# Patient Record
Sex: Female | Born: 1940 | Race: White | Hispanic: No | Marital: Married | Smoking: Never smoker
Health system: Southern US, Community
[De-identification: ages and names within clinical notes are randomized; demographics above are authoritative.]

## PROBLEM LIST (undated history)

## (undated) DIAGNOSIS — K859 Acute pancreatitis without necrosis or infection, unspecified: Secondary | ICD-10-CM

## (undated) DIAGNOSIS — I10 Essential (primary) hypertension: Secondary | ICD-10-CM

## (undated) DIAGNOSIS — Z9109 Other allergy status, other than to drugs and biological substances: Secondary | ICD-10-CM

## (undated) DIAGNOSIS — K7290 Hepatic failure, unspecified without coma: Secondary | ICD-10-CM

## (undated) DIAGNOSIS — E119 Type 2 diabetes mellitus without complications: Secondary | ICD-10-CM

## (undated) HISTORY — DX: Type 2 diabetes mellitus without complications: E11.9

## (undated) HISTORY — PX: OTHER SURGICAL HISTORY: SHX169

## (undated) HISTORY — DX: Other allergy status, other than to drugs and biological substances: Z91.09

## (undated) HISTORY — PX: COLONOSCOPY: SHX174

## (undated) HISTORY — PX: KNEE ARTHROSCOPY: SUR90

## (undated) HISTORY — DX: Hepatic failure, unspecified without coma: K72.90

## (undated) HISTORY — DX: Acute pancreatitis without necrosis or infection, unspecified: K85.90

## (undated) HISTORY — DX: Essential (primary) hypertension: I10

---

## 1978-10-30 HISTORY — PX: CHOLECYSTECTOMY: SHX55

## 1989-08-29 HISTORY — PX: CARPAL TUNNEL RELEASE: SHX101

## 1990-03-30 HISTORY — PX: SHOULDER ARTHROSCOPY: SHX128

## 1993-03-29 HISTORY — PX: ERCP: SHX60

## 1993-05-29 HISTORY — PX: LIVER BIOPSY: SHX301

## 1995-10-25 HISTORY — PX: KNEE ARTHROPLASTY: SHX992

## 1997-10-05 ENCOUNTER — Other Ambulatory Visit: Admission: RE | Admit: 1997-10-05 | Discharge: 1997-10-05 | Payer: Self-pay | Admitting: *Deleted

## 1998-05-18 ENCOUNTER — Ambulatory Visit (HOSPITAL_COMMUNITY): Admission: RE | Admit: 1998-05-18 | Discharge: 1998-05-18 | Payer: Self-pay | Admitting: Sports Medicine

## 1998-05-18 ENCOUNTER — Encounter: Payer: Self-pay | Admitting: Sports Medicine

## 1998-05-31 ENCOUNTER — Encounter: Payer: Self-pay | Admitting: Neurosurgery

## 1998-05-31 ENCOUNTER — Ambulatory Visit (HOSPITAL_COMMUNITY): Admission: RE | Admit: 1998-05-31 | Discharge: 1998-05-31 | Payer: Self-pay | Admitting: Neurosurgery

## 1998-09-15 ENCOUNTER — Encounter: Payer: Self-pay | Admitting: Orthopedic Surgery

## 1998-09-15 ENCOUNTER — Ambulatory Visit (HOSPITAL_COMMUNITY): Admission: RE | Admit: 1998-09-15 | Discharge: 1998-09-15 | Payer: Self-pay | Admitting: Neurosurgery

## 1998-09-26 ENCOUNTER — Other Ambulatory Visit: Admission: RE | Admit: 1998-09-26 | Discharge: 1998-09-26 | Payer: Self-pay | Admitting: *Deleted

## 1998-10-05 ENCOUNTER — Ambulatory Visit (HOSPITAL_COMMUNITY): Admission: RE | Admit: 1998-10-05 | Discharge: 1998-10-05 | Payer: Self-pay | Admitting: Neurosurgery

## 1998-10-11 ENCOUNTER — Ambulatory Visit (HOSPITAL_BASED_OUTPATIENT_CLINIC_OR_DEPARTMENT_OTHER): Admission: RE | Admit: 1998-10-11 | Discharge: 1998-10-11 | Payer: Self-pay | Admitting: Orthopedic Surgery

## 1998-11-30 ENCOUNTER — Observation Stay (HOSPITAL_COMMUNITY): Admission: RE | Admit: 1998-11-30 | Discharge: 1998-12-01 | Payer: Self-pay | Admitting: Neurosurgery

## 1999-01-31 ENCOUNTER — Ambulatory Visit (HOSPITAL_BASED_OUTPATIENT_CLINIC_OR_DEPARTMENT_OTHER): Admission: RE | Admit: 1999-01-31 | Discharge: 1999-01-31 | Payer: Self-pay | Admitting: Orthopedic Surgery

## 1999-11-08 ENCOUNTER — Encounter: Payer: Self-pay | Admitting: Internal Medicine

## 1999-11-08 ENCOUNTER — Encounter: Admission: RE | Admit: 1999-11-08 | Discharge: 1999-11-08 | Payer: Self-pay | Admitting: Internal Medicine

## 2000-09-13 ENCOUNTER — Encounter: Payer: Self-pay | Admitting: Urology

## 2000-09-17 ENCOUNTER — Ambulatory Visit (HOSPITAL_COMMUNITY): Admission: RE | Admit: 2000-09-17 | Discharge: 2000-09-17 | Payer: Self-pay | Admitting: Urology

## 2000-09-17 HISTORY — PX: INCONTINENCE SURGERY: SHX676

## 2000-10-14 ENCOUNTER — Encounter (HOSPITAL_BASED_OUTPATIENT_CLINIC_OR_DEPARTMENT_OTHER): Payer: Self-pay | Admitting: Internal Medicine

## 2000-10-14 ENCOUNTER — Encounter: Admission: RE | Admit: 2000-10-14 | Discharge: 2000-10-14 | Payer: Self-pay | Admitting: Internal Medicine

## 2000-11-08 ENCOUNTER — Encounter (HOSPITAL_BASED_OUTPATIENT_CLINIC_OR_DEPARTMENT_OTHER): Payer: Self-pay | Admitting: Internal Medicine

## 2000-11-08 ENCOUNTER — Encounter: Admission: RE | Admit: 2000-11-08 | Discharge: 2000-11-08 | Payer: Self-pay | Admitting: Internal Medicine

## 2001-11-04 ENCOUNTER — Encounter: Admission: RE | Admit: 2001-11-04 | Discharge: 2001-11-04 | Payer: Self-pay | Admitting: Internal Medicine

## 2001-11-04 ENCOUNTER — Encounter: Payer: Self-pay | Admitting: Internal Medicine

## 2002-09-28 ENCOUNTER — Ambulatory Visit (HOSPITAL_COMMUNITY): Admission: RE | Admit: 2002-09-28 | Discharge: 2002-09-28 | Payer: Self-pay | Admitting: Gastroenterology

## 2002-11-03 ENCOUNTER — Encounter: Admission: RE | Admit: 2002-11-03 | Discharge: 2002-11-03 | Payer: Self-pay | Admitting: Internal Medicine

## 2002-11-03 ENCOUNTER — Encounter: Payer: Self-pay | Admitting: Internal Medicine

## 2002-11-27 ENCOUNTER — Encounter: Admission: RE | Admit: 2002-11-27 | Discharge: 2002-11-27 | Payer: Self-pay | Admitting: Internal Medicine

## 2003-11-04 ENCOUNTER — Encounter: Admission: RE | Admit: 2003-11-04 | Discharge: 2003-11-04 | Payer: Self-pay | Admitting: Internal Medicine

## 2004-06-12 ENCOUNTER — Other Ambulatory Visit: Admission: RE | Admit: 2004-06-12 | Discharge: 2004-06-12 | Payer: Self-pay | Admitting: Internal Medicine

## 2004-11-07 ENCOUNTER — Encounter: Admission: RE | Admit: 2004-11-07 | Discharge: 2004-11-07 | Payer: Self-pay | Admitting: Internal Medicine

## 2005-03-29 HISTORY — PX: OTHER SURGICAL HISTORY: SHX169

## 2005-04-03 ENCOUNTER — Inpatient Hospital Stay (HOSPITAL_COMMUNITY): Admission: EM | Admit: 2005-04-03 | Discharge: 2005-04-15 | Payer: Self-pay | Admitting: *Deleted

## 2005-07-17 ENCOUNTER — Encounter (INDEPENDENT_AMBULATORY_CARE_PROVIDER_SITE_OTHER): Payer: Self-pay | Admitting: *Deleted

## 2005-07-17 ENCOUNTER — Inpatient Hospital Stay (HOSPITAL_COMMUNITY): Admission: RE | Admit: 2005-07-17 | Discharge: 2005-07-22 | Payer: Self-pay | Admitting: Surgery

## 2005-07-17 HISTORY — PX: OTHER SURGICAL HISTORY: SHX169

## 2005-11-09 ENCOUNTER — Encounter: Admission: RE | Admit: 2005-11-09 | Discharge: 2005-11-09 | Payer: Self-pay | Admitting: Internal Medicine

## 2006-10-31 ENCOUNTER — Encounter: Admission: RE | Admit: 2006-10-31 | Discharge: 2006-10-31 | Payer: Self-pay | Admitting: Internal Medicine

## 2007-04-07 IMAGING — CT CT ABDOMEN W/O CM
2 of 4 series · 17 of 46 positions shown, 19 images · IV contrast (READICAT)
Comparison: none

CLINICAL DATA: Right lower quadrant and pelvic pain with fever.  Reported known history of biliary cirrhosis.  Cholecystectomy.  Reportedly allergic to IV contrast media.  
ABDOMEN CT WITHOUT CONTRAST:
TECHNIQUE: Multidetector CT imaging of the abdomen was performed following the standard protocol without IV contrast.  Oral Gastrografin was used.
TECHNIQUE: Multidetector CT imaging of the pelvis was performed following the standard protocol without IV contrast. Oral Gastrografin was used.

[Series 2: abd pelvis w/o cm · axial · non-contrast · 0.86mm/px · z∈[-479,-64]mm · 14 of 91 slices shown, 16 images]
[im 4/91  soft-tissue]
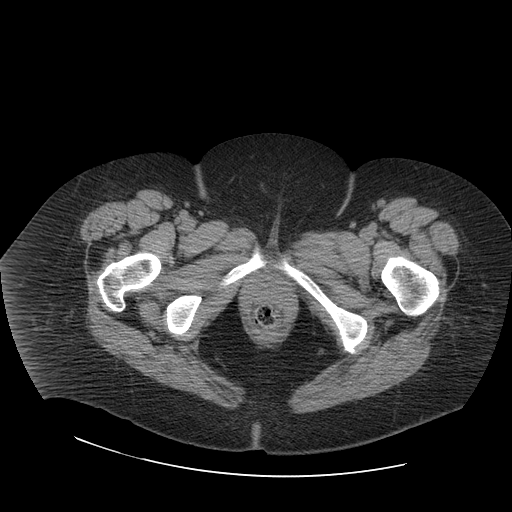
[im 4/91  bone]
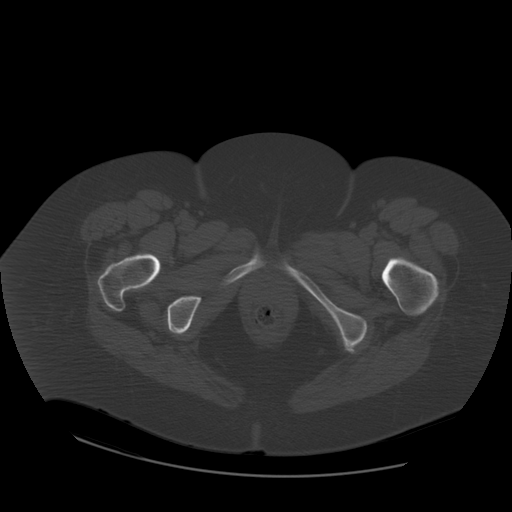
[im 11/91  soft-tissue]
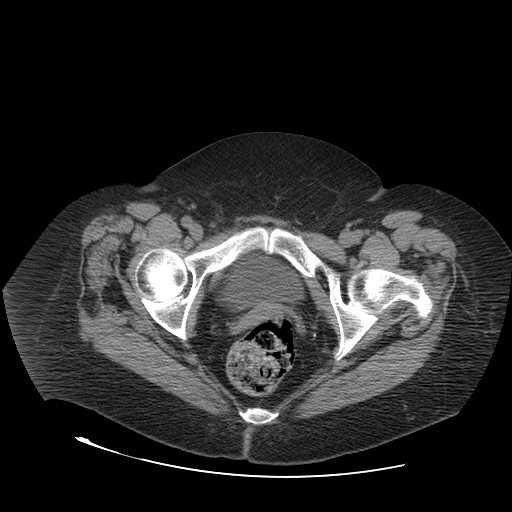
[im 19/91  soft-tissue]
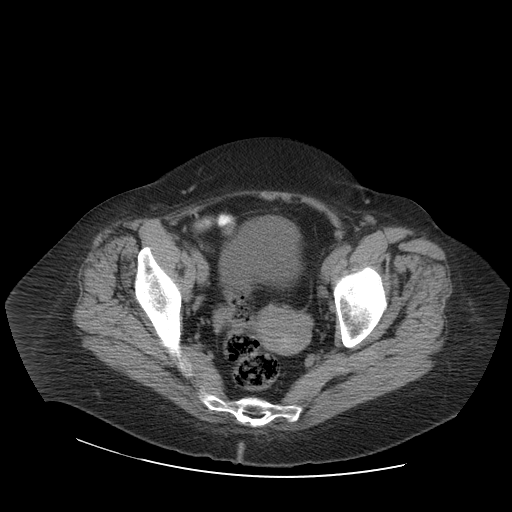
[im 26/91  soft-tissue]
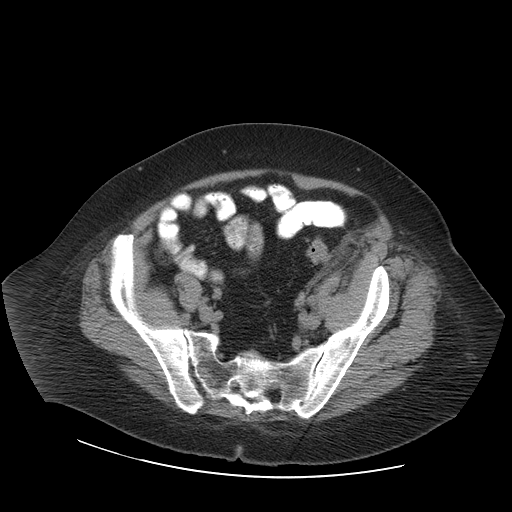
[im 29/91  soft-tissue]
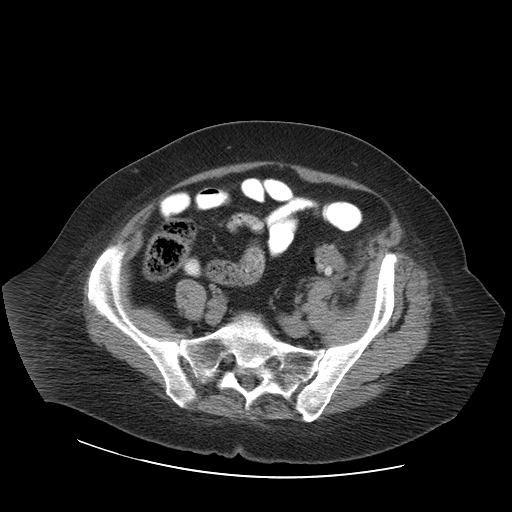
[im 37/91  soft-tissue]
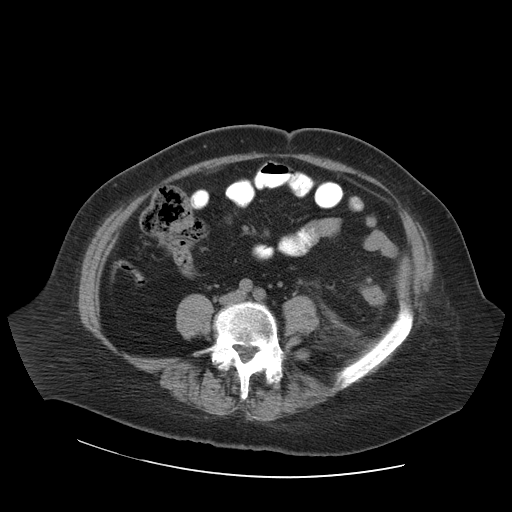
[im 44/91  soft-tissue]
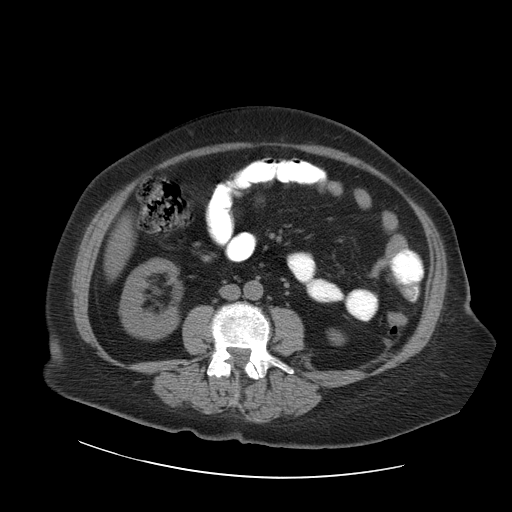
[im 47/91  soft-tissue]
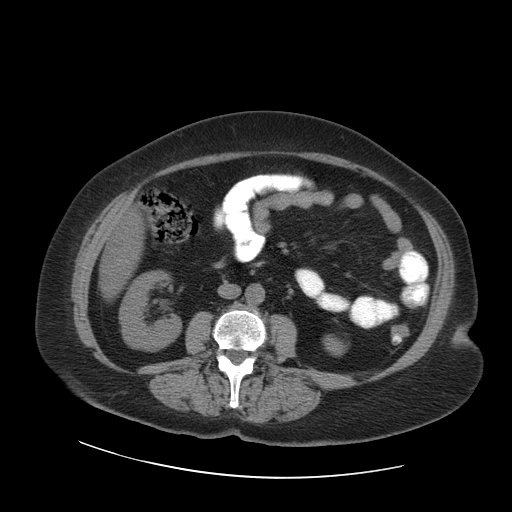
[im 55/91  soft-tissue]
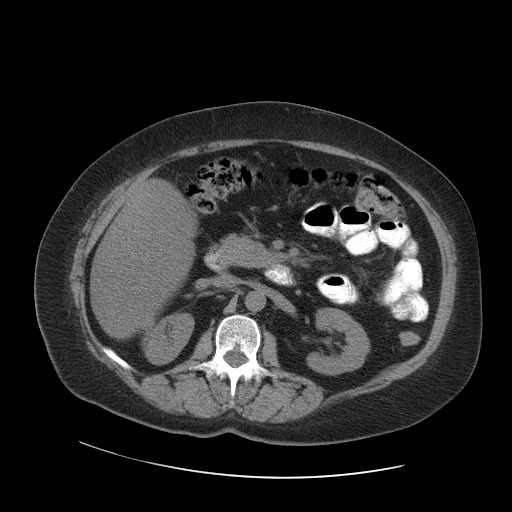
[im 55/91  bone]
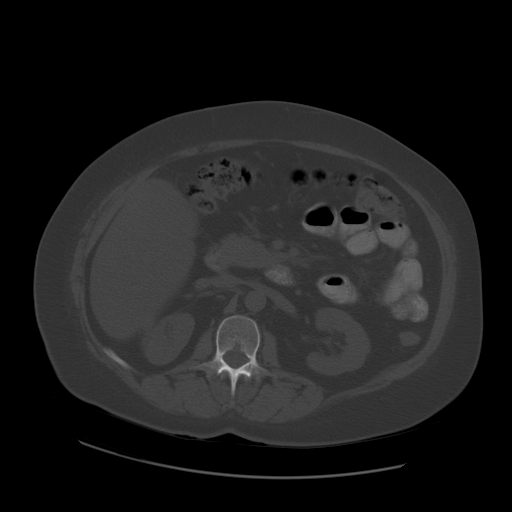
[im 62/91  soft-tissue]
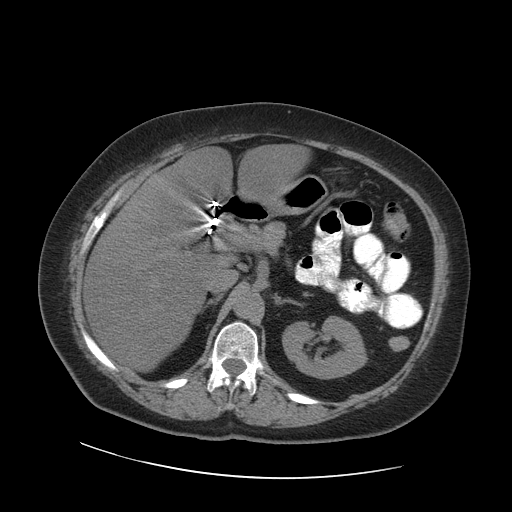
[im 69/91  soft-tissue]
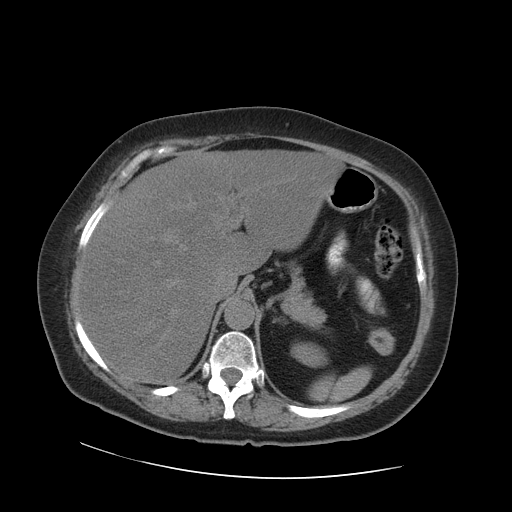
[im 73/91  soft-tissue]
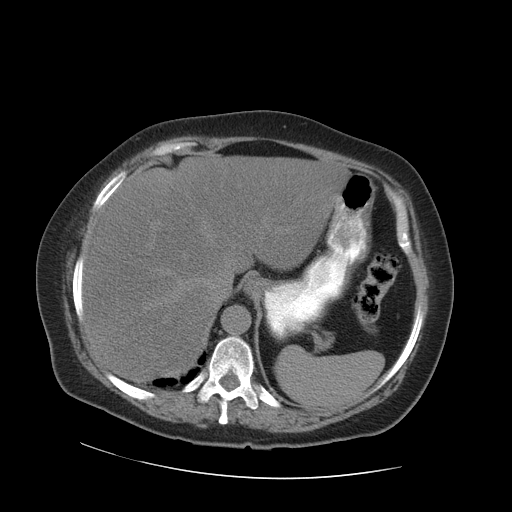
[im 80/91  soft-tissue]
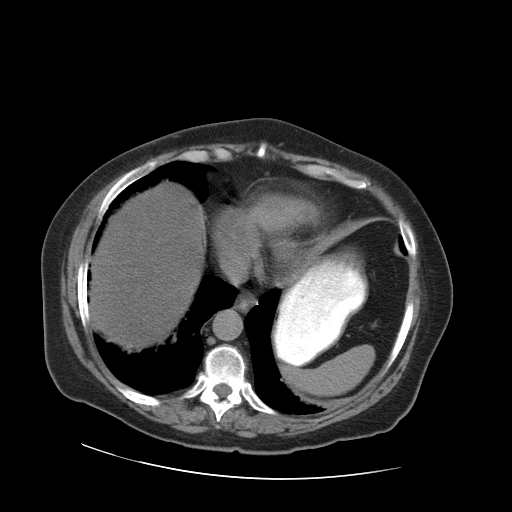
[im 87/91  soft-tissue]
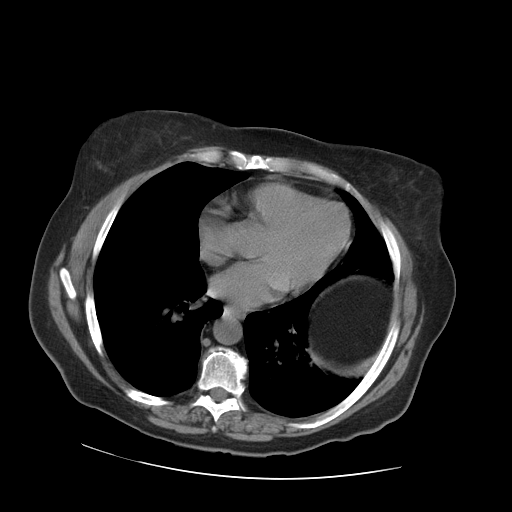

[Series 104: reformatted · coronal · 0.92mm/px · 3 of 120 slices shown]
[im 40/120  soft-tissue]
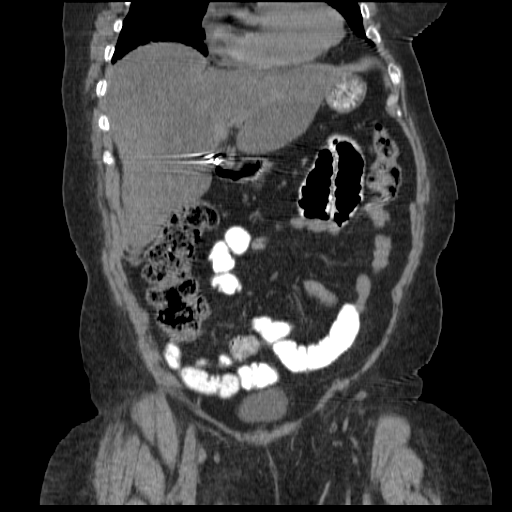
[im 53/120  soft-tissue]
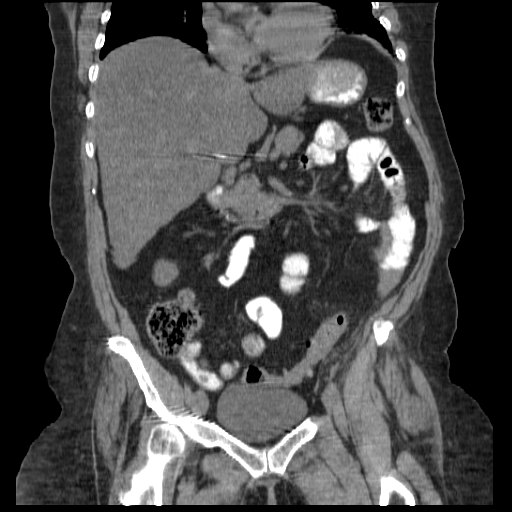
[im 67/120  soft-tissue]
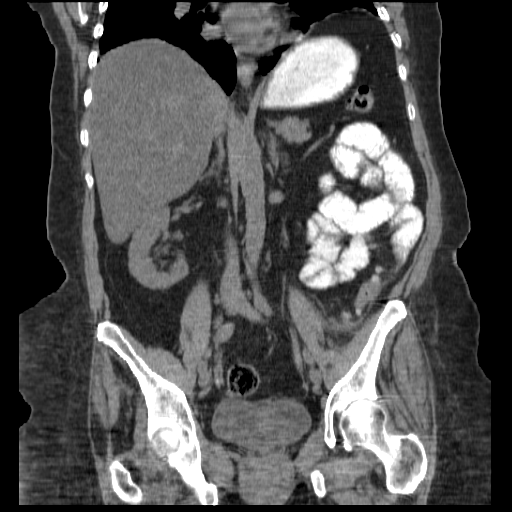

[17 of 46 positions shown; findings below may reference images not displayed]

FINDINGS: Mild linear scarring in the lower lung zones.  Diffuse fatty infiltration of the liver.  There may be mild hepatomegaly.  Metallic clips gallbladder fossa secondary to cholecystectomy.  Negative for obstructive uropathy.  Abnormal thickening of the wall of the distal descending colon and thickening involving the lateroconal and anterior pararenal fascia.  Multiple nearby diverticula.   focal perforation as there appear to be focal extraluminal small gas collections in a left posterolateral paracolic position.  I feel the findings are most compatible with diverticulitis.
IMPRESSION: CT findings are most compatible with distal descending colon diverticulitis with probably focal perforation.  The patient is at risk for formation of pericolic abscess.  
PELVIS CT WITHOUT CONTRAST:
FINDINGS: Uterus and adnexal regions appear unremarkable.  Bladder unremarkable.  Pelvic sidewalls well defined.  See abdominal CT report concerning findings compatible with distal descending colon diverticulitis.
IMPRESSION: No acute primary pelvic abnormality.  Distal descending colon diverticulitis.

## 2007-04-08 IMAGING — CR DG CHEST 1V PORT
1 series · 1 of 1 positions shown · non-contrast
Comparison: 04/03/05.

CLINICAL DATA: Evaluate PICC line placement. 
 PORTABLE CHEST ? 1 VIEW ? 04/04/05:

[view not recorded]
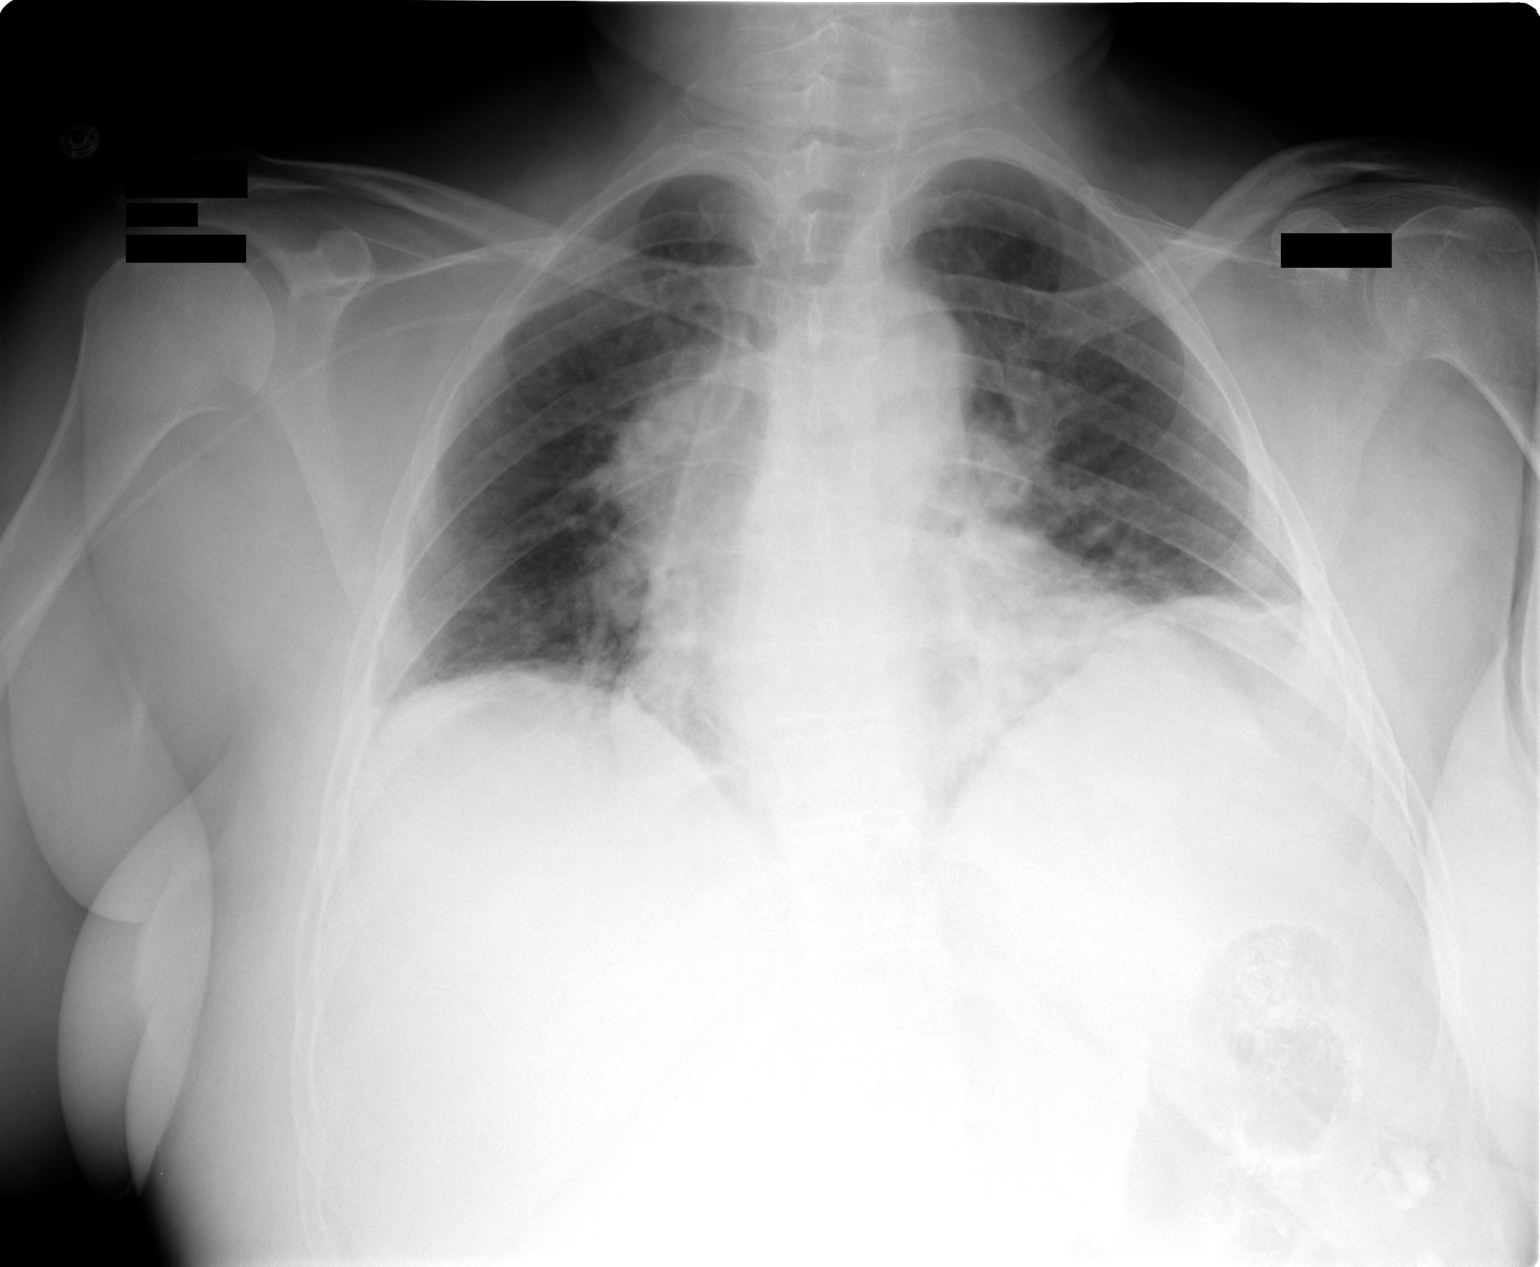

[1 of 1 positions shown; findings below may reference images not displayed]

FINDINGS: The lungs are underinflated.  There is bibasilar atelectasis.
 A right perihilar mass is identified.  It was not seen on the exam from 04/03/05.  However, on that examination, the patient was rotated to the left, and this may have been obscured by the mediastinum.  I would recommend a dedicated PA and lateral chest, as this may represent lymphadenopathy, a mediastinal mass, or PA hypertension.
 The PICC line is within the cavoatrial junction.
IMPRESSION: 1.  PICC line within cavoatrial junction. 
 2.  Right perihilar mass.  Recommend PA lateral chest. 
 3.  Underinflated lungs with bibasilar atelectasis.

## 2007-11-04 ENCOUNTER — Encounter: Admission: RE | Admit: 2007-11-04 | Discharge: 2007-11-04 | Payer: Self-pay | Admitting: Internal Medicine

## 2008-11-02 ENCOUNTER — Encounter: Admission: RE | Admit: 2008-11-02 | Discharge: 2008-11-02 | Payer: Self-pay | Admitting: Internal Medicine

## 2009-11-01 ENCOUNTER — Encounter: Admission: RE | Admit: 2009-11-01 | Discharge: 2009-11-01 | Payer: Self-pay | Admitting: Internal Medicine

## 2010-02-19 ENCOUNTER — Encounter: Payer: Self-pay | Admitting: Internal Medicine

## 2010-04-19 ENCOUNTER — Ambulatory Visit: Payer: Self-pay | Admitting: Ophthalmology

## 2010-05-15 ENCOUNTER — Ambulatory Visit: Payer: Self-pay | Admitting: Ophthalmology

## 2010-06-12 ENCOUNTER — Ambulatory Visit: Payer: Self-pay | Admitting: Ophthalmology

## 2010-06-16 NOTE — Op Note (Signed)
[facility]  Patient:    [name]                         MRN: [id number] Proc. Date: 01/31/99 Adm. Date:  [id number] Attending:  Sypher, Jerilynn Birkenhead CC:         Katy Fitch. Sypher, Montez Hageman., M.D. (2 copies)                           Operative Report  PREOPERATIVE DIAGNOSIS:  Severe degenerative arthritis, right index finger, distal interphalangeal joint.  POSTOPERATIVE DIAGNOSIS:  Severe degenerative arthritis, right index finger, distal interphalangeal joint.  OPERATION:  Arthrodesis of right index finger DIP joint.  OPERATING SURGEON:  Katy Fitch. Sypher, Montez Hageman., M.D.  ASSISTANT:  Jaquelyn Bitter. Chabon, P.A.  ANESTHESIA:  Axillary block.  SUPERVISING ANESTHESIOLOGIST:  Judie Petit, M.D.  INDICATIONS:  [name] is a 70 year old woman who has had bilateral index finger DIP arthrosis.  She is status post left index DIP arthrodesis and was quite pleased with the result.  She requested a similar procedure on the right for pain control.  DESCRIPTION OF PROCEDURE:  Anjeli Casad was brought to the operating room and placed in the supine position on the operating table.  Following induction of general sedation, axillary block was placed.  When anesthesia was satisfactory, the arm was prepped with Betadine soap and solution and sterilely draped.  Following exsanguination of the limb with an Esmarch bandage, an arterial tourniquet was inflated to 220 mmHg.  The procedure commenced with a dorsal curvilinear incision, exposing the DIP joint.  The collateral ligaments were released and resected. he proximal portion of the distal phalanx was tailored with a power bur to a cup shape, and the middle phalangeal heal tailored with rongeurs and a power bur to a bullet shape.  The joint was positioned at 10 degrees flexion and neutral rotation, and no radial or ulnar deviation, and secured with two 0.035 inch Kirschner wires with retrograde  technique.  A C-arm fluoroscopic image was obtained revealing satisfactory position. However, there was a small bony gap on the ulnar aspect.  This was subsequently filled with an underlying bone graft harvested from the metaphysis of the middle phalanx. his was morcellized and packed into the interosseous space.  This was a very satisfactory way to correct the bony deficit.  The wound was the repaired with a mattress suture of 4-0 Vicryl in the extensor  tendon followed by repair of the skin with interrupted sutures of 5-0 nylon.  The wound was dressed with Xeroflo, sterile gauze, and little foam splint. There were no apparent complications.  For after care, Ms. Sundby was given a Marcaine  block of 0.25% at the metacarpal head level followed by prescriptions for Percocet 1-2 tablets p.o. q.4-6h. p.r.n. pain, 20 tablets without refill, and also, Keflex 500 mg 1 p.o. q.8h. x 4 days as a prophylaxis antibiotic. DD:  01/31/99 TD:  01/31/99 Job: [id number]

## 2010-06-16 NOTE — H&P (Signed)
NAMEDALLANA, [name]                 ACCOUNT NO.:  [id number]   MEDICAL RECORD NO.:  [id number]          PATIENT TYPE:  EMS   LOCATION:  MAJO                         FACILITY:  [facility]   PHYSICIAN:  Ladell Pier, M.D.   DATE OF BIRTH:  07-30-1940   DATE OF ADMISSION:  04/03/2005  DATE OF DISCHARGE:                                HISTORY & PHYSICAL   CHIEF COMPLAINT:  Abdominal pain.   HISTORY OF PRESENT ILLNESS:  Patient is a 70 year old white female.  Past  medical history significant for multi medical problems including  hypertension, GERD, diverticulosis, and osteopenia.  Patient complains of  intermittent abdominal pain for approximately two weeks, then two to three  days ago the pain resolved but she was sore across the lower abdomen.  Then  last night the pain returned which is much more severe than previous.  It  radiated across the lower abdomen.  She took __________ aspirin without  relief of her symptoms.  She started experiencing some fevers, chills, and  nausea, but no vomiting.  Her husband took her to the emergency department.   PAST MEDICAL HISTORY:  1.  Hypertension.  2.  Obesity with a BMI of 35.  3.  Peripheral edema.  4.  GERD.  5.  Osteopenia.  6.  Diverticulosis and colonoscopy done by Dr. Sherin Quarry July 2006.  7.  History of primary biliary cirrhosis followed at [facility].      1.  ERCP done in 1995.      2.  Liver biopsy 1995.  8.  Osteoarthritis with multiple orthopedic surgery per Dr. Eulah Pont.  9.  Cholecystectomy 1980.   FAMILY HISTORY:  Father died of leukemia at 1 years old.  Mother died of  anorexia at 66 years old.  Two sisters, one with lupus.  The other one is  healthy.   SOCIAL HISTORY:  She is married with two grown children.  Used tobacco for a  short time while in college.  She is presently in disability secondary to  multiple joint problems.  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  She drinks socially and she lives in  [place].   MEDICATIONS:  1.  Tenex 2 mg q.a.m.  2.  Actigall 300 mg two twice a day.  3.  Ecotrin 325 mg daily.  4.  Ranitidine 150 mg daily, 75 mg q.h.s. p.r.n.  5.  Centrum daily.  6.  Reglan 10 mg q.a.c. and h.s.  7.  Lisinopril 40 mg daily.  8.  HCTZ 25 mg daily.  9.  Lasix 20 mg daily p.r.n.   ALLERGIES:  IODINE, BETADINE, VICODIN, DEMEROL, CODEINE, BACTRIM, SULFA,  TALWIN, CALCIUM CHANNEL BLOCKERS, TUSSIN, TUSSIONEX.   REVIEW OF SYSTEMS:  See HPI.   PHYSICAL EXAMINATION:  VITAL SIGNS:  Temperature 101.2, blood pressure  130/55, pulse 100, respirations 24, pulse ox 94% on room air.  HEENT:  Head is normocephalic, atraumatic.  Pupils are equal, round, and  reactive to light.  Throat without erythema.  CARDIOVASCULAR:  Regular rate and rhythm.  LUNGS:  Clear bilaterally.  No  wheezes, rhonchi, or rales.  ABDOMEN:  Soft with left lower quadrant focal tenderness.  Positive bowel  sounds.  EXTREMITIES:  Without edema.   LABORATORIES:  Sodium 137, potassium 4.4, chloride 104, CO2 23, BUN 25,  creatinine 1, glucose 218.  WBC 19, hemoglobin 12.3, platelets 386.  LFTs  are normal.  CT of the abdomen showed distal distending colon with focal  perforation, risks of pericolic abscess formation.   ASSESSMENT/PLAN:  1.  Small perforated diverticulitis __________ antibiotic Zosyn.  Will give      intravenous fluids, PPI intravenous.  Will pain control with PCA pump.      Thanks to Dr. Abbey Chatters, surgical consult.  2.  Hypertension.  Will cover blood pressure with clonidine patch for now      since patient is n.p.o.  3.  Leukocytosis most likely secondary to infection.  Will monitor.  Patient      is on intravenous antibiotics.  4.  Hyperglycemia.  This is most likely secondary to demargination from      stress and infection.  Will monitor and cover with sliding scale      insulin.  5.  Gastroesophageal reflux disease.  Will place patient on __________.      Ladell Pier,  M.D.  Electronically Signed     NJ/MEDQ  D:  04/03/2005  T:  04/03/2005  Job:  [id number]

## 2010-06-16 NOTE — Op Note (Signed)
NAMEZULEY, [name]                 ACCOUNT NO.:  [id number]   MEDICAL RECORD NO.:  [id number]          PATIENT TYPE:  INP   LOCATION:  5706                         FACILITY:  [facility]   PHYSICIAN:  Velora Heckler, MD      DATE OF BIRTH:  1940-12-01   DATE OF PROCEDURE:  07/17/2005  DATE OF DISCHARGE:                                 OPERATIVE REPORT   PREOPERATIVE DIAGNOSIS:  Sigmoid diverticular disease   POSTOPERATIVE DIAGNOSIS:  Sigmoid diverticular disease.   PROCEDURE:  Sigmoid colectomy.   SURGEON:  Velora Heckler, M.D., FACS   ASSISTANT:  Anselm Pancoast. Zachery Dakins, M.D., FACS   ANESTHESIA:  General.   ESTIMATED BLOOD LOSS:  Minimal.   PREPARATION:  Betadine.   COMPLICATIONS:  None.   INDICATIONS:  The patient is a 70 year old white female from [place],  [place], well known to my surgical practice.  The patient has a  history of diverticular disease of the colon dating back to 1999.  She has  been treated previously with oral antibiotics as an outpatient.  A full  colonoscopy in July 2006 showed significant sigmoid diverticulosis involving  the descending and sigmoid colon.  In March 2007, the patient was admitted  to [facility] with acute diverticulitis with contained perforation.  She was treated with bowel rest, antibiotics, and aspiration of the abscess.  She resolved but has remained mildly symptomatic.  The patient was evaluated  by Dr. Larey Dresser at [facility].  She is followed for primary biliary  cirrhosis.  He feels she is well-compensated and prepared at this time to  undergo exploratory laparotomy and sigmoid colectomy for treatment of  sigmoid diverticular disease.   BODY OF REPORT:  The procedure is done in OR number 3 at [facility]  The patient was brought to the operating room and placed in a  supine position on the operating room table.  Following administration of  general anesthesia, the patient is prepped and draped in  the usual strict  aseptic fashion.  After ascertaining that an adequate level of anesthesia  been obtained, a midline abdominal incision was made from just above the  umbilicus to just above the pubis in the midline.  Dissection was carried  down through subcutaneous tissues.  The fascia was incised in the midline  and the peritoneal cavity is entered cautiously.  A Balfour retractor was  placed for exposure.  The sigmoid colon shows chronic inflammatory changes.  It was adherent to the abdominal wall and to the pelvic sidewall.  With  gentle blunt dissection, the omentum was mobilized off of the sigmoid colon.  The sigmoid colon is mobilized off of its lateral attachments.  The  mesentery is incised and the sigmoid colon is fully mobilized from the true  pelvis and along the white line of Toldt.  Dissection was carried up the  peritoneal reflection to the splenic flexure.  Extension is placed on the  Balfour to maintain exposure.  After exploring the abdomen and seeing a  normal small bowel, a relatively normal appearing liver  with some fatty  infiltration, reasonable adhesions in the right upper quadrant pain, and a  normal stomach with the orogastric tube properly positioned, we proceeded  with sigmoid colectomy.   A point in the distal descending colon just above the onset of significant  diverticulosis was selected and transected between bowel clamps.  The  mesentery was taken down using the LigaSure from [facility] for division and  hemostasis of the mesentery.  Dissection was carried distally with the  LigaSure.  A point in the distal sigmoid colon at approximately the  peritoneal reflection was selected.  The bowel appeared grossly normal at  this level.  The bowel was transected between bowel clamps and hemostasis  obtained with the electrocautery.  The remaining mesentery is then taken  down with the LigaSure and the entire sigmoid colon is resected and  submitted to pathology  for review.  Good hemostasis was noted along the  mesenteric line where the LigaSure had been utilized.  Next, an end-to-end  anastomosis is created between the distal descending colon and the distal  sigmoid colon.  This was performed in a single layer of interrupted 3-0 silk  sutures.  There is good approximation of the mucosal and muscular walls of  the bowel without tension.  The abdomen was copiously irrigated with warm  saline.  This was evacuated.  Good hemostasis was noted throughout.  The  omentum is used to cover the anastomosis.  All packs were removed.  The  Balfour retractor is removed.  The midline wound is closed with interrupted  figure-of-eight #1 Novofil sutures.  The subcutaneous tissues are irrigated.  The skin was closed with stainless steel staples.  Sterile dressings were  applied.  The patient is awakened from anesthesia and brought to the  recovery room in stable condition.  The patient tolerated the procedure  well.      Velora Heckler, MD  Electronically Signed     TMG/MEDQ  D:  07/17/2005  T:  07/17/2005  Job:  [id number]   cc:   Ladell Pier, M.D.  Fax: [phone]   Larey Dresser, M.D.  Division of Gastroenterology  [facility]   Tasia Catchings, M.D.  Fax: [phone]

## 2010-06-16 NOTE — Discharge Summary (Signed)
[name], [name]                 ACCOUNT NO.:  [id number]   MEDICAL RECORD NO.:  [id number]          PATIENT TYPE:  INP   LOCATION:  5732                         FACILITY:  [facility]   PHYSICIAN:  Ladell Pier, M.D.   DATE OF BIRTH:  [birth year]   DATE OF ADMISSION:  04/03/2005  DATE OF DISCHARGE:  04/15/2005                                 DISCHARGE SUMMARY   ADDENDUM:  The patient was discharged on April 15, 2004.  Discharge was held  secondary to patient developing leukocytosis and increase in abdominal pain.  On the 18th her leukocytosis had resolved and her abdominal pain improved.  She will follow up in the office in one week.      Ladell Pier, M.D.  Electronically Signed     NJ/MEDQ  D:  04/15/2005  T:  04/16/2005  Job:  [id number]

## 2010-06-16 NOTE — Op Note (Signed)
[facility]  Patient:    [name], [name] Visit Number: [id number] MRN: [id number]          Service Type: DSU Location: DAY Attending Physician:  Evlyn Clines Proc. Date: 09/17/00 Adm. Date:  09/17/2000   CC:         Barry Dienes. Eloise Harman, M.D.   Operative Report  PROCEDURE:  SPARC sling.  PREOPERATIVE DIAGNOSIS:  Stress incontinence.  POSTOPERATIVE DIAGNOSIS:  Stress incontinence.  SURGEON:  Excell Seltzer. Annabell Howells, M.D.  ANESTHESIA:  Spinal and local.  DRAINS:  Foley.  COMPLICATIONS:  None.  INDICATIONS FOR PROCEDURE:  Ms. [name] is a 70 year old white female with a history of progressive incontinence requiring multiple pads. She was noted to have type 3 and genuine stress incontinence and has elected to undergo a SPARC sling. The risks were explained in detail in her outline in the office note.  FINDINGS AND PROCEDURE:  The patient was taken to the operating room. She received p.o. Tequin preoperatively. A spinal anesthetic was induced. She was placed in the lithotomy position. Her mons was shaved. She was prepped with Hibiclens solution due to an iodine allergy. She was then draped in the usual sterile fashion. A Foley catheter was inserted, the bladder was drained. The anterior vaginal wall was infiltrated with approximately 10 cc of 1% lidocaine with epinephrine. The retropubic space was infiltrated with 20 cc of 0.125% Marcaine on each side to aid in dissection and postoperative pain control. A midline anterior vaginal wall incision was made over the mid proximal urethra. The mucosa was elevated laterally for approximately a centimeter or two to allow passage of the sling trocars. A sponge was placed vaginally. I then made two 1.5 cm incisions transversely above the pubic bone approximately 2 cm above and 2 cm lateral to the midline, one on the right and one on the left. The fat was then spread down to the fascia with the Strulle scissors.  At this point, the [facility] trocar was passed on the right. The tip of the trocar was passed down to the top of the pubis and then walked behind the pubis and then passed into the vaginal incision under finger guidance. The urethra was pushed to the contralateral side to avoid injury. Once the right trocar had been placed, the left trocar was placed in an identical fashion. At this point, cystoscopy was performed using a 22 [place] scope and a 70 degree lens. Examination revealed no evidence of bladder injury or passage of the trocar through the bladder wall or urethra. At this point, the scope was removed, the bladder was left full. The [facility] sling material was then secured to the trocars and drawn into the abdominal incision. Once appropriate positioning over the mid urethra had been assured and the tension was felt to be adequate, the outer sheath of the sling was removed on each side. The patient was asked to cough and she still had some reduced but mild leakage in the high lithotomy position with a sling in position. The Foley catheter was then reinserted. I was able to easily pass the Strulle scissors beneath the sling with about a 2/8 of an inch gap. The sling was felt to be appropriately loose. The abdominal ends of the sling material were then trimmed and allowed to retract subcutaneously. At this point, the vaginal wall incision was closed using a running locked 2-0 Vicryl stitch. The abdominal incisions were cleaned and secured with Steri-Strips. Initially and iodoform vaginal pack was placed  but it was rapidly realized within 10 seconds that this was not appropriate due to her Betadine intolerance. The pack was removed and the vaginal area was copiously irrigated with both sterile water and saline. The decision was made to leave no packs since there had been minimal bleeding with the procedure. At this point, she was taken down from the lithotomy position and moved to the recovery  room in stable condition. Her Foley was placed to straight drainage. There were no complications during the procedure. Attending Physician:  Evlyn Clines DD:  09/17/00 TD:  09/17/00 Job: [id number]

## 2010-06-16 NOTE — Discharge Summary (Signed)
[name], [name]                 ACCOUNT NO.:  [id number]   MEDICAL RECORD NO.:  [id number]          PATIENT TYPE:  INP   LOCATION:  5732                         FACILITY:  [facility]   PHYSICIAN:  Ladell Pier, M.D.   DATE OF BIRTH:  [birth year]   DATE OF ADMISSION:  04/03/2005  DATE OF DISCHARGE:  04/14/2005                                 DISCHARGE SUMMARY   DISCHARGE DIAGNOSES:  1.  Diverticulitis with perforate diverticular abscess.  2.  Hypertension.  3.  Obesity with basic metabolic index of 35.  4.  Peripheral edema.  5.  Gastroesophageal reflux disease.  6.  Osteopenia.  7.  Diverticulosis and colonoscopy done by Dr. Sherin Quarry in July 2002.  8.  History of primary biliary cirrhosis followed at [facility].  9.  Endoscopic retrograde cholangiopancreatography done in 1995.  10. Liver biopsy done in 1995.  11. Osteoarthritis with multiple orthopedic surgeries done by Dr. Eulah Pont.  12. Status post cholecystectomy.  13. Hyperglycemia secondary to infection, resolved prior to discharge.  14. Anemia, stable with negative colonoscopy.  15. Hypokalemia, resolved prior to discharge.   DISCHARGE MEDICATIONS:  1.  Tenex 2 mg daily.  2.  Actigall 300 mg twice daily.  3.  Ecotrin 325 mg daily.  4.  Ranitidine 150 mg in the a.m., 75 mg at bedtime.  5.  Centrum daily.  6.  Reglan 10 mg with meals and at bedtime.  7.  Lisinopril 40 mg daily.  8.  HCTZ 25 mg daily.  9.  Lasix 20 mg daily as needed for edema.  10. Cipro 500 mg twice daily x10 days.  11. Flagyl 500 mg three times daily x10 days.  12. Advil 600 mg three times daily as needed.   FOLLOW UP:  The patient is to follow up with Dr. Abbey Chatters in 2 weeks,  telephone number [phone].  The patient to follow up with Dr. Olena Leatherwood in 2  weeks.   PROCEDURE:  Abscess drained from the diverticular abscess.   CONSULTATIONS:  Adolph Pollack, M.D., surgery.   HISTORY OF PRESENT ILLNESS:  The patient is a 70 year old, white female with  past medical history significant for multiple medical problems including  hypertension, GERD, diverticulosis and osteopenia.  The patient presented  with abdominal pain x2 weeks on and off that got extremely severe, so she  came to the emergency room.  In the ER, she was noted to have diverticulitis  with perforation.   For past medical history, family history, social history, medications,  allergies and review of systems, per admission H&P.   DISCHARGE PHYSICAL EXAMINATION:  VITAL SIGNS:  Temperature 98.0, pulse 81,  respirations 20, blood pressure 109/60, pulse 97% on room air.  CBG 125-133.  HEENT:  Normocephalic, atraumatic.  Pupils equal round and reactive to  light.  CARDIAC:  Regular rate and rhythm.  LUNGS:  Clear bilaterally.  ABDOMEN:  Positive bowel sounds.  Nontender.  EXTREMITIES:  No edema.   HOSPITAL COURSE:  Problem 1.  DIVERTICULAR ABSCESS:  She was admitted and  placed on Zosyn IV 3.375 q.6h.  Her symptoms,  however, remained, although  with some improvement.  She was also made NPO for bowel rest and started  later on during hospitalization on TPN.  She continued experiencing pain so  she had a repeat CT that showed a small abscess.  Two days prior to  discharge, the abscess was drained and she felt well.  She was tolerating a  diet and the TNA was discontinued.  The Zosyn was discontinued and she was  placed on Cipro and Flagyl and was discharged home on Cipro, Flagyl and  Advil for pain management.   Problem 2.  HYPERGLYCEMIA:  She had hyperglycemia during the  hospitalization, most likely secondary to stress.  Hyperglycemia resolved  prior to discharge.  She was on Lantus and sliding scale insulin.  Her  hyperglycemia resolved prior to discharge.   Problem 3.  HYPERTENSION:  She was NPO at first and then was placed on a  clonidine patch.  As she was taking p.o., she was placed back on her home  medications.   Problem 4.  ANEMIA:  She had anemia during her  hospitalization that was  stable.  She had a colonoscopy that was normal.  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  Will  address that further on discharge.   Problem 5.  THROMBOCYTOSIS:  She had elevated platelets most likely  secondary to infection.  Will monitor when she follows up as outpatient.   DISCHARGE LABORATORY DATA AND X-RAY FINDINGS:  Cultures were pending on  discharge.  Sodium 138, potassium 4.0, chloride 105, CO2 24, glucose 123,  BUN 21, creatinine 1.0, calcium 8.7.  WBC 11.8, hemoglobin 9.8, platelets  510.  Prealbumin 14.5, magnesium 2.4, phosphorus 4.1, iron 23, TIBC 256,  percent saturation 9, ferritin 155.      Ladell Pier, M.D.  Electronically Signed     NJ/MEDQ  D:  04/13/2005  T:  04/15/2005  Job:  [id number]   cc:   Adolph Pollack, M.D.  [address]  [place]  [place] [address]

## 2010-06-16 NOTE — Consult Note (Signed)
[name], [name]                 ACCOUNT NO.:  [id number]   MEDICAL RECORD NO.:  [id number]          PATIENT TYPE:  EMS   LOCATION:  MAJO                         FACILITY:  [facility]   PHYSICIAN:  Adolph Pollack, M.D.DATE OF BIRTH:  05/22/1940   DATE OF CONSULTATION:  04/03/2005  DATE OF DISCHARGE:                                   CONSULTATION   REFERRING PHYSICIAN:  Dr. Sheppard Penton. [name].   REASON FOR CONSULTATION:  Diverticulitis.   HISTORY OF PRESENT ILLNESS:  [name] is a 70 year old female who at 9  o'clock last night had the onset of some severe cramping lower abdominal  pain.  She had a large bowel movement, but this did not relieve the pain and  the pain began to worsen.  It initially started in the right lower quadrant,  then radiated to the left lower quadrant.  She had some nausea and chills,  but denied any fever, vomiting or diarrhea, or blood in the stool.  Early  this morning, she presented to the emergency department and was evaluated.  She was noted to have a leukocytosis.  A CT scan was performed which  demonstrated distal descending colon diverticulitis with microperforation  and no abscess formation.  At this time, we were asked to see her.   PAST MEDICAL HISTORY:  1.  Primary biliary cirrhosis.  2.  Hypertension.  3.  Gastroesophageal reflux disease.  4.  Bilateral knee injuries.   MEDICATIONS:  Lisinopril, Tenex, Actigall, Ecotrin, hydrochlorothiazide,  Zyrtec, Reglan, Zantac.   PREVIOUS OPERATIONS:  1.  Cholecystectomy.  2.  Carpal tunnel release.  3.  Shoulder surgery.  4.  She has had multiple bilateral knee surgeries including bilateral knee      replacements.  5.  Wrist surgery.  6.  Back surgery.  7.  Liver biopsy.  8.  Bladder suspension.  9.  Fusion of fingers twice.   ALLERGIES:  IODINE, BETADINE, VICODIN ES, DEMEROL, CODEINE, BACTRIM, TALWIN,  TUSSEND, CALCIUM CHANNEL BLOCKERS.   SOCIAL HISTORY:  She is married and is here with her  husband.  No tobacco or  alcohol use.   REVIEW OF SYSTEMS:  CARDIOVASCULAR:  She denies any heart disease or heart  attack.  PULMONARY:  She denies any pneumonia or emphysema.  She states she  in the past has had reflux-induced asthma, but since she has been treated  for that, that has resolved.  GI:  She has a primary biliary cirrhosis, as  above.  GU:  No kidney stones.  ENDOCRINE:  No diabetes.  HEMATOLOGIC:  She  has received blood transfusions in the past, denies bleeding disorders or  DVT.  NEUROLOGIC:  No strokes or seizures.   PHYSICAL EXAMINATION:  GENERAL:  An obese female who appears to be in no  acute distress.  She is pleasant and cooperative.  VITAL SIGNS:  Her maximum temperature in the emergency department is 101.2  at 4 o'clock this morning; recent blood pressure 138/55.  Pulse 100.  Saturation 94% on room air.  EYES:  Extraocular motions are intact.  No icterus.  NECK:  Supple without masses or obvious thyroid enlargement.  RESPIRATORY:  Breath sounds are equal and clear.  Respirations are  unlabored.  CARDIOVASCULAR:  Heart demonstrates a slightly increased rate with a regular  rhythm.  I do not hear a murmur.  There is trace lower extremity edema.  ABDOMEN:  Soft and obese.  A right upper quadrant scar is noted.  There is  tenderness mostly in the left lower quadrant with mild tenderness in the  lower midline and right lower quadrant.  No obvious masses.  Hypoactive  bowel sounds noted.  SKIN:  No jaundice noted.   LABORATORY DATA:  White cell count is elevated at 19,000 with a leftward  shift.  She also has hyperglycemia with a glucose of 218.  Her liver  function tests are within normal limits, lipase normal.   IMAGING STUDIES:  CT was reviewed.   IMPRESSION:  Acute left colon/sigmoid colon diverticulitis with  microperforation, but no abscess formation.  She has a complicated medical  history, specifically that of the primary biliary cirrhosis.    RECOMMENDATIONS:  Treat her with bowel rest and intravenous antibiotics.  If  she gets better in 2-3 days, then she may be able to have a diet started,  placed on oral antibiotics and discharged.  If she does not improve, then my  suggestion to her potentially could be an operation with a colostomy.  If in  the future she would have another bout of this treated electively, then she  may need to consider an elective partial colectomy.      Adolph Pollack, M.D.  Electronically Signed     TJR/MEDQ  D:  04/03/2005  T:  04/04/2005  Job:  [id number]   cc:   Ladell Pier, M.D.  Fax: [phone]

## 2010-06-16 NOTE — Op Note (Signed)
NAME:  [name], [name]                           ACCOUNT NO.:  [id number]   MEDICAL RECORD NO.:  [id number]                   PATIENT TYPE:  AMB   LOCATION:  ENDO                                 FACILITY:  [facility]   PHYSICIAN:  Petra Kuba, M.D.                 DATE OF BIRTH:  05-18-1940   DATE OF PROCEDURE:  09/28/2002  DATE OF DISCHARGE:                                 OPERATIVE REPORT   PROCEDURE:  EGD.   INDICATIONS FOR PROCEDURE:  Upper tract symptoms.  Consent was signed after  risks, benefits, methods, and options were thoroughly discussed in the  office with both the patient and her husband.   MEDICATIONS:  Demerol 70, Versed 7.5.   DESCRIPTION OF PROCEDURE:  A video endoscope was inserted by direct vision.  The esophagus was normal. There were no signs of varices or any reflux  changes. The scope passed into the stomach, advanced through a normal  pyloris into a normal duodenal bulb, and around the C-loop to a normal  second portion of the duodenum. The scope was withdrawn back to the bulb. A  good look there ruled out ulcer in all locations. The scope was slowly  withdrawn.  In the antrum, a few aspirin-induced erosions were seen and they  were mild. The scope was then retroflexed. The angularis and fundus were  normal. High in the cardia, there were a few linear erosions, probable  cameron lesions without worrisome stigmata.  Lesser and greater curve were  normal on retroflexed visualization.  Straight visualization of the stomach  did not reveal any additional findings. Air was suctioned and the scope was  slowly withdrawn.  Again, a good look at the esophagus confirmed a tiny  hiatal hernia, ruled out any other abnormalities. The scope was removed.  The patient tolerated the procedure well and there was no obvious immediate  complications.   ENDOSCOPIC ASSESSMENT:  1. Tiny hiatal hernia and small cardia Cameron lesions.  2. Few antral erosions, probably  aspirin-induced.  3. Otherwise normal esophagogastroduodenoscopy.    PLAN:  Acid blockers as needed.  Happy to see back p.r.n.  Return care to  Dr. Katrinka Blazing and [facility] for the customary health care maintenance to include  yearly rectals and guaiacs and happy to see back at the time of colon  screening, but would leave the timing of that to Dr. Merril Abbe.                                               Petra Kuba, M.D.    MEM/MEDQ  D:  09/28/2002  T:  09/28/2002  Job:  [id number]   cc:   Ike Bene, M.D.  [address], [address]  [place]  [place] [address]  Fax:  [phone]   [facility] Transplant Team at [facility]

## 2010-06-16 NOTE — Discharge Summary (Signed)
NAMEVANCE, [name]                 ACCOUNT NO.:  [id number]   MEDICAL RECORD NO.:  [id number]          PATIENT TYPE:  INP   LOCATION:  5706                         FACILITY:  [facility]   PHYSICIAN:  Velora Heckler, MD      DATE OF BIRTH:  [birth year]   DATE OF ADMISSION:  07/17/2005  DATE OF DISCHARGE:  07/22/2005                                 DISCHARGE SUMMARY   REASON FOR ADMISSION:  Sigmoid diverticular disease.   HISTORY OF PRESENT ILLNESS:  The patient is a 70 year old white female from  [place], [place], well known to my surgical practice. The patient  has had long-standing history of diverticular disease dating back to 1999.  She has multiple episodes treated outpatient with oral antibiotics and clear  liquid diet. The patient was admitted in March 2007 with acute  diverticulitis to [facility]. The patient resolved with medical  management. She was seen by her hepatologist at [facility] who follows her for primary biliary cirrhosis. She was cleared for  surgery and now returns to undergo sigmoid colectomy.   HOSPITAL COURSE:  The patient was admitted June 19. She underwent sigmoid  colectomy without complication. Postoperatively, she did well. She started a  clear liquid diet and was advanced by the second postoperative day to full  liquids. She slowly advanced to a regular diet and was prepared for  discharge home on the 5th postoperative day.   DISCHARGE PLAN:  The patient is discharged home on July 22, 2005.   DISCHARGE MEDICATIONS:  Percocet as needed for pain and home medications as  per usual.   FOLLOW UP:  The patient will be seen back at my office at [facility] in 1 to 2 weeks for wound check.   FINAL DIAGNOSIS:  Diverticulosis and acute diverticulitis.   CONDITION ON DISCHARGE:  Improved.      Velora Heckler, MD  Electronically Signed     TMG/MEDQ  D:  09/06/2005  T:  09/06/2005  Job:  [id number]

## 2010-09-27 ENCOUNTER — Other Ambulatory Visit: Payer: Self-pay | Admitting: Internal Medicine

## 2010-09-27 DIAGNOSIS — Z1231 Encounter for screening mammogram for malignant neoplasm of breast: Secondary | ICD-10-CM

## 2010-11-02 ENCOUNTER — Ambulatory Visit
Admission: RE | Admit: 2010-11-02 | Discharge: 2010-11-02 | Disposition: A | Discharge status: Home / Self Care | Payer: BLUE CROSS/BLUE SHIELD | Source: Ambulatory Visit | Attending: Internal Medicine | Admitting: Internal Medicine

## 2010-11-02 DIAGNOSIS — Z1231 Encounter for screening mammogram for malignant neoplasm of breast: Secondary | ICD-10-CM

## 2010-11-20 ENCOUNTER — Emergency Department (HOSPITAL_COMMUNITY)
Admission: EM | Admit: 2010-11-20 | Discharge: 2010-11-21 | Discharge status: Against Medical Advice | Payer: Medicare Other | Attending: Emergency Medicine | Admitting: Emergency Medicine

## 2010-11-20 DIAGNOSIS — Z0389 Encounter for observation for other suspected diseases and conditions ruled out: Secondary | ICD-10-CM | POA: Insufficient documentation

## 2011-02-13 DIAGNOSIS — H4089 Other specified glaucoma: Secondary | ICD-10-CM | POA: Diagnosis not present

## 2011-05-01 DIAGNOSIS — K745 Biliary cirrhosis, unspecified: Secondary | ICD-10-CM | POA: Diagnosis not present

## 2011-05-01 DIAGNOSIS — E119 Type 2 diabetes mellitus without complications: Secondary | ICD-10-CM | POA: Diagnosis not present

## 2011-05-01 DIAGNOSIS — E669 Obesity, unspecified: Secondary | ICD-10-CM | POA: Diagnosis not present

## 2011-05-01 DIAGNOSIS — I10 Essential (primary) hypertension: Secondary | ICD-10-CM | POA: Diagnosis not present

## 2011-05-01 DIAGNOSIS — E7800 Pure hypercholesterolemia, unspecified: Secondary | ICD-10-CM | POA: Diagnosis not present

## 2011-05-08 DIAGNOSIS — Z1331 Encounter for screening for depression: Secondary | ICD-10-CM | POA: Diagnosis not present

## 2011-05-08 DIAGNOSIS — F411 Generalized anxiety disorder: Secondary | ICD-10-CM | POA: Diagnosis not present

## 2011-05-08 DIAGNOSIS — I10 Essential (primary) hypertension: Secondary | ICD-10-CM | POA: Diagnosis not present

## 2011-05-08 DIAGNOSIS — E119 Type 2 diabetes mellitus without complications: Secondary | ICD-10-CM | POA: Diagnosis not present

## 2011-05-14 DIAGNOSIS — M19049 Primary osteoarthritis, unspecified hand: Secondary | ICD-10-CM | POA: Diagnosis not present

## 2011-05-29 ENCOUNTER — Emergency Department: Payer: Self-pay | Admitting: Emergency Medicine

## 2011-05-29 DIAGNOSIS — R42 Dizziness and giddiness: Secondary | ICD-10-CM | POA: Diagnosis not present

## 2011-05-29 DIAGNOSIS — I959 Hypotension, unspecified: Secondary | ICD-10-CM | POA: Diagnosis not present

## 2011-05-30 DIAGNOSIS — I10 Essential (primary) hypertension: Secondary | ICD-10-CM | POA: Diagnosis not present

## 2011-05-30 DIAGNOSIS — R42 Dizziness and giddiness: Secondary | ICD-10-CM | POA: Diagnosis not present

## 2011-06-08 DIAGNOSIS — R55 Syncope and collapse: Secondary | ICD-10-CM | POA: Diagnosis not present

## 2011-06-08 DIAGNOSIS — I10 Essential (primary) hypertension: Secondary | ICD-10-CM | POA: Diagnosis not present

## 2011-07-09 DIAGNOSIS — R55 Syncope and collapse: Secondary | ICD-10-CM | POA: Diagnosis not present

## 2011-07-26 DIAGNOSIS — I6529 Occlusion and stenosis of unspecified carotid artery: Secondary | ICD-10-CM | POA: Diagnosis not present

## 2011-07-26 DIAGNOSIS — R55 Syncope and collapse: Secondary | ICD-10-CM | POA: Diagnosis not present

## 2011-07-26 DIAGNOSIS — I10 Essential (primary) hypertension: Secondary | ICD-10-CM | POA: Diagnosis not present

## 2011-09-25 ENCOUNTER — Other Ambulatory Visit: Payer: Self-pay | Admitting: Internal Medicine

## 2011-09-25 DIAGNOSIS — Z1231 Encounter for screening mammogram for malignant neoplasm of breast: Secondary | ICD-10-CM

## 2011-10-26 DIAGNOSIS — I10 Essential (primary) hypertension: Secondary | ICD-10-CM | POA: Diagnosis not present

## 2011-10-26 DIAGNOSIS — R55 Syncope and collapse: Secondary | ICD-10-CM | POA: Diagnosis not present

## 2011-10-28 DIAGNOSIS — Z23 Encounter for immunization: Secondary | ICD-10-CM | POA: Diagnosis not present

## 2011-11-02 ENCOUNTER — Ambulatory Visit
Admission: RE | Admit: 2011-11-02 | Discharge: 2011-11-02 | Disposition: A | Discharge status: Home / Self Care | Payer: BLUE CROSS/BLUE SHIELD | Source: Ambulatory Visit | Attending: Internal Medicine | Admitting: Internal Medicine

## 2011-11-02 DIAGNOSIS — Z1231 Encounter for screening mammogram for malignant neoplasm of breast: Secondary | ICD-10-CM

## 2011-11-13 DIAGNOSIS — I10 Essential (primary) hypertension: Secondary | ICD-10-CM | POA: Diagnosis not present

## 2011-11-13 DIAGNOSIS — E119 Type 2 diabetes mellitus without complications: Secondary | ICD-10-CM | POA: Diagnosis not present

## 2011-11-20 DIAGNOSIS — M1710 Unilateral primary osteoarthritis, unspecified knee: Secondary | ICD-10-CM | POA: Diagnosis not present

## 2011-12-07 DIAGNOSIS — S9030XA Contusion of unspecified foot, initial encounter: Secondary | ICD-10-CM | POA: Diagnosis not present

## 2012-02-08 DIAGNOSIS — R55 Syncope and collapse: Secondary | ICD-10-CM | POA: Diagnosis not present

## 2012-02-08 DIAGNOSIS — I10 Essential (primary) hypertension: Secondary | ICD-10-CM | POA: Diagnosis not present

## 2012-02-22 DIAGNOSIS — H40009 Preglaucoma, unspecified, unspecified eye: Secondary | ICD-10-CM | POA: Diagnosis not present

## 2012-04-29 DIAGNOSIS — I10 Essential (primary) hypertension: Secondary | ICD-10-CM | POA: Diagnosis not present

## 2012-04-29 DIAGNOSIS — E039 Hypothyroidism, unspecified: Secondary | ICD-10-CM | POA: Diagnosis not present

## 2012-04-29 DIAGNOSIS — K1370 Unspecified lesions of oral mucosa: Secondary | ICD-10-CM | POA: Diagnosis not present

## 2012-04-29 DIAGNOSIS — K745 Biliary cirrhosis, unspecified: Secondary | ICD-10-CM | POA: Diagnosis not present

## 2012-04-29 DIAGNOSIS — M1990 Unspecified osteoarthritis, unspecified site: Secondary | ICD-10-CM | POA: Diagnosis not present

## 2012-05-20 DIAGNOSIS — I10 Essential (primary) hypertension: Secondary | ICD-10-CM | POA: Diagnosis not present

## 2012-05-20 DIAGNOSIS — E1139 Type 2 diabetes mellitus with other diabetic ophthalmic complication: Secondary | ICD-10-CM | POA: Diagnosis not present

## 2012-05-20 DIAGNOSIS — Z1331 Encounter for screening for depression: Secondary | ICD-10-CM | POA: Diagnosis not present

## 2012-05-20 DIAGNOSIS — E11319 Type 2 diabetes mellitus with unspecified diabetic retinopathy without macular edema: Secondary | ICD-10-CM | POA: Diagnosis not present

## 2012-06-26 DIAGNOSIS — R21 Rash and other nonspecific skin eruption: Secondary | ICD-10-CM | POA: Diagnosis not present

## 2012-06-27 DIAGNOSIS — M310 Hypersensitivity angiitis: Secondary | ICD-10-CM | POA: Diagnosis not present

## 2012-08-15 DIAGNOSIS — I10 Essential (primary) hypertension: Secondary | ICD-10-CM | POA: Diagnosis not present

## 2012-08-15 DIAGNOSIS — E119 Type 2 diabetes mellitus without complications: Secondary | ICD-10-CM | POA: Diagnosis not present

## 2012-09-30 ENCOUNTER — Other Ambulatory Visit: Payer: Self-pay

## 2012-09-30 DIAGNOSIS — Z1231 Encounter for screening mammogram for malignant neoplasm of breast: Secondary | ICD-10-CM

## 2012-10-08 DIAGNOSIS — Z23 Encounter for immunization: Secondary | ICD-10-CM | POA: Diagnosis not present

## 2012-10-30 ENCOUNTER — Ambulatory Visit: Payer: Medicare Other

## 2012-11-04 ENCOUNTER — Ambulatory Visit
Admission: RE | Admit: 2012-11-04 | Discharge: 2012-11-04 | Disposition: A | Discharge status: Home / Self Care | Payer: Medicare Other | Source: Ambulatory Visit

## 2012-11-04 DIAGNOSIS — Z1231 Encounter for screening mammogram for malignant neoplasm of breast: Secondary | ICD-10-CM

## 2012-11-25 DIAGNOSIS — M25569 Pain in unspecified knee: Secondary | ICD-10-CM | POA: Diagnosis not present

## 2013-03-03 ENCOUNTER — Other Ambulatory Visit: Payer: Self-pay | Admitting: Orthopedic Surgery

## 2013-03-03 DIAGNOSIS — M545 Low back pain, unspecified: Secondary | ICD-10-CM | POA: Diagnosis not present

## 2013-03-03 DIAGNOSIS — E1139 Type 2 diabetes mellitus with other diabetic ophthalmic complication: Secondary | ICD-10-CM | POA: Diagnosis not present

## 2013-03-03 DIAGNOSIS — M25559 Pain in unspecified hip: Secondary | ICD-10-CM | POA: Diagnosis not present

## 2013-03-03 DIAGNOSIS — IMO0002 Reserved for concepts with insufficient information to code with codable children: Secondary | ICD-10-CM

## 2013-03-03 DIAGNOSIS — E11319 Type 2 diabetes mellitus with unspecified diabetic retinopathy without macular edema: Secondary | ICD-10-CM | POA: Diagnosis not present

## 2013-03-03 DIAGNOSIS — I10 Essential (primary) hypertension: Secondary | ICD-10-CM | POA: Diagnosis not present

## 2013-03-05 DIAGNOSIS — Z961 Presence of intraocular lens: Secondary | ICD-10-CM | POA: Diagnosis not present

## 2013-03-05 DIAGNOSIS — H2510 Age-related nuclear cataract, unspecified eye: Secondary | ICD-10-CM | POA: Diagnosis not present

## 2013-03-08 ENCOUNTER — Ambulatory Visit
Admission: RE | Admit: 2013-03-08 | Discharge: 2013-03-08 | Disposition: A | Discharge status: Home / Self Care | Payer: Medicare Other | Source: Ambulatory Visit | Attending: Orthopedic Surgery | Admitting: Orthopedic Surgery

## 2013-03-08 DIAGNOSIS — M5126 Other intervertebral disc displacement, lumbar region: Secondary | ICD-10-CM | POA: Diagnosis not present

## 2013-03-08 DIAGNOSIS — M5137 Other intervertebral disc degeneration, lumbosacral region: Secondary | ICD-10-CM | POA: Diagnosis not present

## 2013-03-08 DIAGNOSIS — IMO0002 Reserved for concepts with insufficient information to code with codable children: Secondary | ICD-10-CM

## 2013-03-10 DIAGNOSIS — IMO0002 Reserved for concepts with insufficient information to code with codable children: Secondary | ICD-10-CM | POA: Diagnosis not present

## 2013-03-10 DIAGNOSIS — M5126 Other intervertebral disc displacement, lumbar region: Secondary | ICD-10-CM | POA: Diagnosis not present

## 2013-03-10 DIAGNOSIS — M545 Low back pain, unspecified: Secondary | ICD-10-CM | POA: Diagnosis not present

## 2013-03-13 DIAGNOSIS — IMO0002 Reserved for concepts with insufficient information to code with codable children: Secondary | ICD-10-CM | POA: Diagnosis not present

## 2013-03-13 DIAGNOSIS — M5126 Other intervertebral disc displacement, lumbar region: Secondary | ICD-10-CM | POA: Diagnosis not present

## 2013-03-31 DIAGNOSIS — IMO0002 Reserved for concepts with insufficient information to code with codable children: Secondary | ICD-10-CM | POA: Diagnosis not present

## 2013-03-31 DIAGNOSIS — M5126 Other intervertebral disc displacement, lumbar region: Secondary | ICD-10-CM | POA: Diagnosis not present

## 2013-04-03 DIAGNOSIS — M5126 Other intervertebral disc displacement, lumbar region: Secondary | ICD-10-CM | POA: Diagnosis not present

## 2013-04-03 DIAGNOSIS — IMO0002 Reserved for concepts with insufficient information to code with codable children: Secondary | ICD-10-CM | POA: Diagnosis not present

## 2013-04-20 DIAGNOSIS — M5126 Other intervertebral disc displacement, lumbar region: Secondary | ICD-10-CM | POA: Diagnosis not present

## 2013-04-20 DIAGNOSIS — M545 Low back pain, unspecified: Secondary | ICD-10-CM | POA: Diagnosis not present

## 2013-04-20 DIAGNOSIS — IMO0002 Reserved for concepts with insufficient information to code with codable children: Secondary | ICD-10-CM | POA: Diagnosis not present

## 2013-04-21 DIAGNOSIS — M5126 Other intervertebral disc displacement, lumbar region: Secondary | ICD-10-CM | POA: Diagnosis not present

## 2013-04-21 DIAGNOSIS — M5137 Other intervertebral disc degeneration, lumbosacral region: Secondary | ICD-10-CM | POA: Diagnosis not present

## 2013-04-21 DIAGNOSIS — M545 Low back pain, unspecified: Secondary | ICD-10-CM | POA: Diagnosis not present

## 2013-04-28 DIAGNOSIS — M545 Low back pain, unspecified: Secondary | ICD-10-CM | POA: Diagnosis not present

## 2013-04-28 DIAGNOSIS — M5126 Other intervertebral disc displacement, lumbar region: Secondary | ICD-10-CM | POA: Diagnosis not present

## 2013-04-28 DIAGNOSIS — M5137 Other intervertebral disc degeneration, lumbosacral region: Secondary | ICD-10-CM | POA: Diagnosis not present

## 2013-05-05 DIAGNOSIS — M5137 Other intervertebral disc degeneration, lumbosacral region: Secondary | ICD-10-CM | POA: Diagnosis not present

## 2013-05-05 DIAGNOSIS — M545 Low back pain, unspecified: Secondary | ICD-10-CM | POA: Diagnosis not present

## 2013-05-05 DIAGNOSIS — M5126 Other intervertebral disc displacement, lumbar region: Secondary | ICD-10-CM | POA: Diagnosis not present

## 2013-06-04 DIAGNOSIS — K745 Biliary cirrhosis, unspecified: Secondary | ICD-10-CM | POA: Diagnosis not present

## 2013-06-23 DIAGNOSIS — L821 Other seborrheic keratosis: Secondary | ICD-10-CM | POA: Diagnosis not present

## 2013-08-27 DIAGNOSIS — R42 Dizziness and giddiness: Secondary | ICD-10-CM | POA: Diagnosis not present

## 2013-08-27 DIAGNOSIS — I10 Essential (primary) hypertension: Secondary | ICD-10-CM | POA: Diagnosis not present

## 2013-09-28 ENCOUNTER — Other Ambulatory Visit: Payer: Self-pay

## 2013-09-28 DIAGNOSIS — Z1231 Encounter for screening mammogram for malignant neoplasm of breast: Secondary | ICD-10-CM

## 2013-10-03 DIAGNOSIS — Z23 Encounter for immunization: Secondary | ICD-10-CM | POA: Diagnosis not present

## 2013-10-06 DIAGNOSIS — M25569 Pain in unspecified knee: Secondary | ICD-10-CM | POA: Diagnosis not present

## 2013-10-20 DIAGNOSIS — E782 Mixed hyperlipidemia: Secondary | ICD-10-CM | POA: Diagnosis not present

## 2013-10-20 DIAGNOSIS — I10 Essential (primary) hypertension: Secondary | ICD-10-CM | POA: Diagnosis not present

## 2013-10-20 DIAGNOSIS — H9319 Tinnitus, unspecified ear: Secondary | ICD-10-CM | POA: Diagnosis not present

## 2013-10-20 DIAGNOSIS — E039 Hypothyroidism, unspecified: Secondary | ICD-10-CM | POA: Diagnosis not present

## 2013-10-20 DIAGNOSIS — IMO0001 Reserved for inherently not codable concepts without codable children: Secondary | ICD-10-CM | POA: Diagnosis not present

## 2013-10-29 ENCOUNTER — Ambulatory Visit
Admission: RE | Admit: 2013-10-29 | Discharge: 2013-10-29 | Disposition: A | Discharge status: Home / Self Care | Payer: Medicare Other | Source: Ambulatory Visit

## 2013-10-29 DIAGNOSIS — Z1231 Encounter for screening mammogram for malignant neoplasm of breast: Secondary | ICD-10-CM

## 2013-11-06 DIAGNOSIS — H9113 Presbycusis, bilateral: Secondary | ICD-10-CM | POA: Diagnosis not present

## 2013-11-06 DIAGNOSIS — H9313 Tinnitus, bilateral: Secondary | ICD-10-CM | POA: Diagnosis not present

## 2013-11-06 DIAGNOSIS — H905 Unspecified sensorineural hearing loss: Secondary | ICD-10-CM | POA: Diagnosis not present

## 2014-03-25 DIAGNOSIS — Z961 Presence of intraocular lens: Secondary | ICD-10-CM | POA: Diagnosis not present

## 2014-04-21 DIAGNOSIS — F419 Anxiety disorder, unspecified: Secondary | ICD-10-CM | POA: Diagnosis not present

## 2014-04-21 DIAGNOSIS — E1165 Type 2 diabetes mellitus with hyperglycemia: Secondary | ICD-10-CM | POA: Diagnosis not present

## 2014-04-21 DIAGNOSIS — E780 Pure hypercholesterolemia: Secondary | ICD-10-CM | POA: Diagnosis not present

## 2014-04-21 DIAGNOSIS — Z1211 Encounter for screening for malignant neoplasm of colon: Secondary | ICD-10-CM | POA: Diagnosis not present

## 2014-04-21 DIAGNOSIS — Z23 Encounter for immunization: Secondary | ICD-10-CM | POA: Diagnosis not present

## 2014-04-21 DIAGNOSIS — E11319 Type 2 diabetes mellitus with unspecified diabetic retinopathy without macular edema: Secondary | ICD-10-CM | POA: Diagnosis not present

## 2014-04-21 DIAGNOSIS — E039 Hypothyroidism, unspecified: Secondary | ICD-10-CM | POA: Diagnosis not present

## 2014-05-20 DIAGNOSIS — L309 Dermatitis, unspecified: Secondary | ICD-10-CM | POA: Diagnosis not present

## 2014-05-20 DIAGNOSIS — L821 Other seborrheic keratosis: Secondary | ICD-10-CM | POA: Diagnosis not present

## 2014-06-09 DIAGNOSIS — K743 Primary biliary cirrhosis: Secondary | ICD-10-CM | POA: Diagnosis not present

## 2014-06-09 DIAGNOSIS — R1011 Right upper quadrant pain: Secondary | ICD-10-CM | POA: Diagnosis not present

## 2014-06-09 DIAGNOSIS — R600 Localized edema: Secondary | ICD-10-CM | POA: Diagnosis not present

## 2014-07-22 ENCOUNTER — Encounter: Payer: Self-pay | Admitting: Internal Medicine

## 2014-09-15 ENCOUNTER — Ambulatory Visit (AMBULATORY_SURGERY_CENTER): Payer: Self-pay

## 2014-09-15 VITALS — Ht 66.0 in | Wt 197.0 lb

## 2014-09-15 DIAGNOSIS — Z1211 Encounter for screening for malignant neoplasm of colon: Secondary | ICD-10-CM

## 2014-09-15 NOTE — Progress Notes (Signed)
TERRIBLE IV STICK PLEASE ASSIST RN IF POSSIBLE No allergies to eggs or soy No diet/weight loss meds No home oxygen No past problem with anesthesia BUT very sensitive to pain meds!!!!!  Has email  Emmi instructions given for colonoscopy

## 2014-09-16 ENCOUNTER — Telehealth: Payer: Self-pay

## 2014-09-16 ENCOUNTER — Telehealth: Payer: Self-pay | Admitting: Gastroenterology

## 2014-09-16 NOTE — Telephone Encounter (Signed)
Rec'd from [facility] Dr. Janace Aris forward 3 pages to Dr. Havery Moros

## 2014-09-16 NOTE — Telephone Encounter (Signed)
Pt stated she is a very difficult IV start.  Her veins blow easily.  IV access is eventually obtained without use of instrumentation such as doppler/vein finder.  Still OK for [facility]?  Thank you, Nesta Scaturro/PV

## 2014-09-16 NOTE — Telephone Encounter (Signed)
[name],  This pt is cleared for care at [facility]  Thanks,  [name] 

## 2014-09-24 DIAGNOSIS — I10 Essential (primary) hypertension: Secondary | ICD-10-CM | POA: Diagnosis not present

## 2014-09-30 ENCOUNTER — Encounter: Payer: BLUE CROSS/BLUE SHIELD | Admitting: Internal Medicine

## 2014-10-01 ENCOUNTER — Encounter: Payer: BLUE CROSS/BLUE SHIELD | Admitting: Gastroenterology

## 2014-10-01 DIAGNOSIS — Z23 Encounter for immunization: Secondary | ICD-10-CM | POA: Diagnosis not present

## 2014-10-12 ENCOUNTER — Other Ambulatory Visit: Payer: Self-pay

## 2014-10-12 DIAGNOSIS — M25561 Pain in right knee: Secondary | ICD-10-CM | POA: Diagnosis not present

## 2014-10-12 DIAGNOSIS — Z1231 Encounter for screening mammogram for malignant neoplasm of breast: Secondary | ICD-10-CM

## 2014-10-12 DIAGNOSIS — M25562 Pain in left knee: Secondary | ICD-10-CM | POA: Diagnosis not present

## 2014-11-02 ENCOUNTER — Ambulatory Visit
Admission: RE | Admit: 2014-11-02 | Discharge: 2014-11-02 | Disposition: A | Discharge status: Home / Self Care | Payer: Medicare Other | Source: Ambulatory Visit

## 2014-11-02 DIAGNOSIS — Z1231 Encounter for screening mammogram for malignant neoplasm of breast: Secondary | ICD-10-CM | POA: Diagnosis not present

## 2014-12-16 ENCOUNTER — Encounter: Payer: Self-pay | Admitting: Family Medicine

## 2014-12-16 ENCOUNTER — Encounter (INDEPENDENT_AMBULATORY_CARE_PROVIDER_SITE_OTHER): Payer: Self-pay

## 2014-12-16 ENCOUNTER — Ambulatory Visit (INDEPENDENT_AMBULATORY_CARE_PROVIDER_SITE_OTHER): Payer: Medicare Other | Admitting: Family Medicine

## 2014-12-16 VITALS — BP 116/64 | HR 66 | Temp 97.4°F | Ht 66.0 in | Wt 196.8 lb

## 2014-12-16 DIAGNOSIS — H9313 Tinnitus, bilateral: Secondary | ICD-10-CM

## 2014-12-16 DIAGNOSIS — J309 Allergic rhinitis, unspecified: Secondary | ICD-10-CM

## 2014-12-16 DIAGNOSIS — I10 Essential (primary) hypertension: Secondary | ICD-10-CM | POA: Diagnosis not present

## 2014-12-16 DIAGNOSIS — E785 Hyperlipidemia, unspecified: Secondary | ICD-10-CM | POA: Diagnosis not present

## 2014-12-16 DIAGNOSIS — E1169 Type 2 diabetes mellitus with other specified complication: Secondary | ICD-10-CM | POA: Insufficient documentation

## 2014-12-16 DIAGNOSIS — E1159 Type 2 diabetes mellitus with other circulatory complications: Secondary | ICD-10-CM | POA: Insufficient documentation

## 2014-12-16 DIAGNOSIS — K743 Primary biliary cirrhosis: Secondary | ICD-10-CM | POA: Diagnosis not present

## 2014-12-16 DIAGNOSIS — F411 Generalized anxiety disorder: Secondary | ICD-10-CM

## 2014-12-16 DIAGNOSIS — Z8719 Personal history of other diseases of the digestive system: Secondary | ICD-10-CM

## 2014-12-16 DIAGNOSIS — E139 Other specified diabetes mellitus without complications: Secondary | ICD-10-CM | POA: Diagnosis not present

## 2014-12-16 DIAGNOSIS — K219 Gastro-esophageal reflux disease without esophagitis: Secondary | ICD-10-CM

## 2014-12-16 LAB — LIPID PANEL
CHOL/HDL RATIO: 3
Cholesterol: 159 mg/dL (ref 0–200)
HDL: 47.9 mg/dL (ref >39.00)
LDL CALC: 84 mg/dL (ref 0–99)
NONHDL: 111.41
Triglycerides: 138 mg/dL (ref 0.0–149.0)
VLDL: 27.6 mg/dL (ref 0.0–40.0)

## 2014-12-16 LAB — COMPREHENSIVE METABOLIC PANEL
ALT: 12 U/L (ref 0–35)
AST: 17 U/L (ref 0–37)
Albumin: 4.5 g/dL (ref 3.5–5.2)
Alkaline Phosphatase: 63 U/L (ref 39–117)
BILIRUBIN TOTAL: 0.4 mg/dL (ref 0.2–1.2)
BUN: 24 mg/dL — AB (ref 6–23)
CO2: 26 mEq/L (ref 19–32)
CREATININE: 0.84 mg/dL (ref 0.40–1.20)
Calcium: 9.9 mg/dL (ref 8.4–10.5)
Chloride: 103 mEq/L (ref 96–112)
GFR: 70.48 mL/min (ref >60.00)
Glucose, Bld: 122 mg/dL — ABNORMAL HIGH (ref 70–99)
Potassium: 4.3 mEq/L (ref 3.5–5.1)
Sodium: 138 mEq/L (ref 135–145)
TOTAL PROTEIN: 7.8 g/dL (ref 6.0–8.3)

## 2014-12-16 LAB — HEMOGLOBIN A1C: Hgb A1c MFr Bld: 7.1 % — ABNORMAL HIGH (ref 4.6–6.5)

## 2014-12-16 NOTE — Assessment & Plan Note (Signed)
Using alprazolam daily. Encouraged her to use it as needed only to reduce use given risk at her age.

## 2014-12-16 NOTE — Assessment & Plan Note (Signed)
Well controlled on lisinopril and HCTZ

## 2014-12-16 NOTE — Assessment & Plan Note (Signed)
Due for re-eval. 

## 2014-12-16 NOTE — Progress Notes (Signed)
Pre visit review using our clinic review tool, if applicable. No additional management support is needed unless otherwise documented below in the visit note. 

## 2014-12-16 NOTE — Assessment & Plan Note (Signed)
?   Control. Due for re-eval. Encouraged exercise, weight loss, healthy eating habits.

## 2014-12-16 NOTE — Progress Notes (Signed)
Subjective:    Patient ID: [name], female    DOB: February 15, 1940, 74 y.o.   MRN: [id number]  HPI   74 year old female pt presents to establish care. She was previously seen by Sadie Haber with MDs changing often.   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  On Actigall. Followed by Rob Hickman transplant MD at [facility] for liver transplant.     HTN, well controlled on HCTZ and lisinopril. She sees cardiologist at [facility] for episodes of low BP. BP Readings from Last 3 Encounters:  12/16/14 116/64   Allergic rhinitis, well controlled on cetirizine  GERD, well controlled on zantac.  DM, secondary to liver and pancreas issue.  FBS 90-135 lately on metformin daily.  A1C 8?, last check in 03/2014  Generalized anxiety disorder: Using alprazolam... Usually daily.   Last colonoscpy 2006, due now.  She refused.  Has had shingles flu vaccines.  Review of Systems  Constitutional: Negative for fever and fatigue.  HENT: Negative for congestion.   Eyes: Negative for pain.  Respiratory: Negative for cough and shortness of breath.   Cardiovascular: Negative for chest pain, palpitations and leg swelling.  Gastrointestinal: Positive for diarrhea. Negative for abdominal pain.       Secondary to past colon resection.  Genitourinary: Negative for dysuria and vaginal bleeding.  Musculoskeletal: Positive for back pain.  Neurological: Negative for syncope, light-headedness and headaches.  Psychiatric/Behavioral: Negative for confusion, dysphoric mood and decreased concentration. The patient is nervous/anxious.        Objective:   Physical Exam  Constitutional: Vital signs are normal. She appears well-developed and well-nourished. She is cooperative.  Non-toxic appearance. She does not appear ill. No distress.  overweight  HENT:  Head: Normocephalic.  Right Ear: Hearing, tympanic membrane, external ear and ear canal normal. Tympanic membrane is not erythematous, not retracted and  not bulging.  Left Ear: Hearing, tympanic membrane, external ear and ear canal normal. Tympanic membrane is not erythematous, not retracted and not bulging.  Nose: Nose normal. No mucosal edema or rhinorrhea. Right sinus exhibits no maxillary sinus tenderness and no frontal sinus tenderness. Left sinus exhibits no maxillary sinus tenderness and no frontal sinus tenderness.  Mouth/Throat: Uvula is midline, oropharynx is clear and moist and mucous membranes are normal.  Eyes: Conjunctivae, EOM and lids are normal. Pupils are equal, round, and reactive to light. Lids are everted and swept, no foreign bodies found.  Neck: Trachea normal and normal range of motion. Neck supple. Carotid bruit is not present. No thyroid mass and no thyromegaly present.  Cardiovascular: Normal rate, regular rhythm, S1 normal, S2 normal, normal heart sounds, intact distal pulses and normal pulses.  Exam reveals no gallop and no friction rub.   No murmur heard. Pulmonary/Chest: Effort normal and breath sounds normal. No tachypnea. No respiratory distress. She has no decreased breath sounds. She has no wheezes. She has no rhonchi. She has no rales.  Abdominal: Soft. Normal appearance and bowel sounds are normal. She exhibits no distension, no fluid wave, no abdominal bruit and no mass. There is no hepatosplenomegaly. There is no tenderness. There is no rebound, no guarding and no CVA tenderness. No hernia.  Lymphadenopathy:    She has no cervical adenopathy.    She has no axillary adenopathy.  Neurological: She is alert. She has normal strength. No cranial nerve deficit or sensory deficit.  Skin: Skin is warm, dry and intact. No rash noted.  Psychiatric: Her speech is normal and  behavior is normal. Judgment and thought content normal. Her mood appears not anxious. Cognition and memory are normal. She does not exhibit a depressed mood.          Assessment & Plan:

## 2014-12-16 NOTE — Assessment & Plan Note (Signed)
Stable control with no symptoms of zantac daily.

## 2014-12-16 NOTE — Patient Instructions (Addendum)
Stop at lab on way out.  Schedule medicare wellness in 3 months with labs prior.

## 2014-12-17 ENCOUNTER — Encounter: Payer: Self-pay | Admitting: *Deleted

## 2014-12-29 DIAGNOSIS — K743 Primary biliary cirrhosis: Secondary | ICD-10-CM | POA: Diagnosis not present

## 2014-12-30 ENCOUNTER — Ambulatory Visit: Payer: BLUE CROSS/BLUE SHIELD | Admitting: Family Medicine

## 2015-01-12 ENCOUNTER — Other Ambulatory Visit: Payer: Self-pay | Admitting: *Deleted

## 2015-01-12 NOTE — Telephone Encounter (Signed)
Last office visit 12/16/2014.  Ok to refill?

## 2015-01-13 MED ORDER — ALPRAZOLAM 0.25 MG PO TABS: 0.2500 mg | ORAL_TABLET | Freq: Every day | ORAL | Status: DC

## 2015-01-13 NOTE — Telephone Encounter (Signed)
Alprazolam called into [facility].

## 2015-02-14 ENCOUNTER — Other Ambulatory Visit: Payer: Self-pay | Admitting: Family Medicine

## 2015-02-14 NOTE — Telephone Encounter (Signed)
Received faxed refill request for metformin 500 mg. Rx have not yet been prescribed by Dr Diona Browner. Last seen on 12/16/2014. Next CPE on 03/22/2015.

## 2015-02-15 MED ORDER — METFORMIN HCL 500 MG PO TABS: 500.0000 mg | ORAL_TABLET | Freq: Two times a day (BID) | ORAL | Status: DC

## 2015-03-15 ENCOUNTER — Telehealth: Payer: Self-pay | Admitting: Family Medicine

## 2015-03-15 DIAGNOSIS — E785 Hyperlipidemia, unspecified: Secondary | ICD-10-CM

## 2015-03-15 DIAGNOSIS — E139 Other specified diabetes mellitus without complications: Secondary | ICD-10-CM

## 2015-03-15 NOTE — Telephone Encounter (Signed)
-----   Message from Marchia Bond sent at 03/11/2015  4:04 PM EST ----- Regarding: Cpx labs Wed 2/15, need orders. Thanks! :-) Please order  future cpx labs for pt's upcoming lab appt. Thanks Aniceto Boss

## 2015-03-16 ENCOUNTER — Other Ambulatory Visit (INDEPENDENT_AMBULATORY_CARE_PROVIDER_SITE_OTHER): Payer: Medicare Other

## 2015-03-16 DIAGNOSIS — E785 Hyperlipidemia, unspecified: Secondary | ICD-10-CM | POA: Diagnosis not present

## 2015-03-16 DIAGNOSIS — E139 Other specified diabetes mellitus without complications: Secondary | ICD-10-CM | POA: Diagnosis not present

## 2015-03-16 LAB — COMPREHENSIVE METABOLIC PANEL
ALBUMIN: 4.2 g/dL (ref 3.5–5.2)
ALK PHOS: 58 U/L (ref 39–117)
ALT: 11 U/L (ref 0–35)
AST: 16 U/L (ref 0–37)
BUN: 23 mg/dL (ref 6–23)
CALCIUM: 9.7 mg/dL (ref 8.4–10.5)
CO2: 28 mEq/L (ref 19–32)
Chloride: 103 mEq/L (ref 96–112)
Creatinine, Ser: 0.81 mg/dL (ref 0.40–1.20)
GFR: 73.45 mL/min (ref >60.00)
Glucose, Bld: 132 mg/dL — ABNORMAL HIGH (ref 70–99)
POTASSIUM: 4.3 meq/L (ref 3.5–5.1)
SODIUM: 138 meq/L (ref 135–145)
TOTAL PROTEIN: 7.3 g/dL (ref 6.0–8.3)
Total Bilirubin: 0.4 mg/dL (ref 0.2–1.2)

## 2015-03-16 LAB — LIPID PANEL
Cholesterol: 150 mg/dL (ref 0–200)
HDL: 51.3 mg/dL (ref >39.00)
LDL Cholesterol: 85 mg/dL (ref 0–99)
NONHDL: 98.96
Total CHOL/HDL Ratio: 3
Triglycerides: 68 mg/dL (ref 0.0–149.0)
VLDL: 13.6 mg/dL (ref 0.0–40.0)

## 2015-03-16 LAB — HEMOGLOBIN A1C: HEMOGLOBIN A1C: 7 % — AB (ref 4.6–6.5)

## 2015-03-18 DIAGNOSIS — E119 Type 2 diabetes mellitus without complications: Secondary | ICD-10-CM | POA: Diagnosis not present

## 2015-03-18 LAB — HM DIABETES EYE EXAM

## 2015-03-22 ENCOUNTER — Encounter: Payer: Self-pay | Admitting: Family Medicine

## 2015-03-22 ENCOUNTER — Ambulatory Visit (INDEPENDENT_AMBULATORY_CARE_PROVIDER_SITE_OTHER): Payer: Medicare Other | Admitting: Family Medicine

## 2015-03-22 VITALS — BP 132/64 | HR 69 | Temp 98.1°F | Ht 66.0 in | Wt 194.0 lb

## 2015-03-22 DIAGNOSIS — Z7189 Other specified counseling: Secondary | ICD-10-CM

## 2015-03-22 DIAGNOSIS — E139 Other specified diabetes mellitus without complications: Secondary | ICD-10-CM

## 2015-03-22 DIAGNOSIS — I10 Essential (primary) hypertension: Secondary | ICD-10-CM

## 2015-03-22 DIAGNOSIS — E785 Hyperlipidemia, unspecified: Secondary | ICD-10-CM

## 2015-03-22 DIAGNOSIS — M25512 Pain in left shoulder: Secondary | ICD-10-CM

## 2015-03-22 DIAGNOSIS — E669 Obesity, unspecified: Secondary | ICD-10-CM

## 2015-03-22 DIAGNOSIS — Z0000 Encounter for general adult medical examination without abnormal findings: Secondary | ICD-10-CM | POA: Diagnosis not present

## 2015-03-22 DIAGNOSIS — Z1211 Encounter for screening for malignant neoplasm of colon: Secondary | ICD-10-CM

## 2015-03-22 DIAGNOSIS — K743 Primary biliary cirrhosis: Secondary | ICD-10-CM

## 2015-03-22 LAB — HM DIABETES FOOT EXAM

## 2015-03-22 MED ORDER — METFORMIN HCL 500 MG PO TABS: 500.0000 mg | ORAL_TABLET | Freq: Two times a day (BID) | ORAL | Status: DC

## 2015-03-22 MED ORDER — ALPRAZOLAM 0.25 MG PO TABS: 0.2500 mg | ORAL_TABLET | Freq: Every day | ORAL | Status: DC

## 2015-03-22 MED ORDER — GLUCOSE BLOOD VI STRP: ORAL_STRIP | Status: DC

## 2015-03-22 NOTE — Assessment & Plan Note (Signed)
Well controlled on no med. 

## 2015-03-22 NOTE — Progress Notes (Signed)
Pre visit review using our clinic review tool, if applicable. No additional management support is needed unless otherwise documented below in the visit note. 

## 2015-03-22 NOTE — Assessment & Plan Note (Signed)
Discussed lifestyle cahnges. Info given.

## 2015-03-22 NOTE — Patient Instructions (Addendum)
Work on low carb diet and start regular exercise as able 3 times a week. Consider water exercise. Keep working on weight loss as able.  Stop at lab on way out. Let me know if interested on bone density. Call to set up referral with [facility] for left shoulder pain.

## 2015-03-22 NOTE — Assessment & Plan Note (Signed)
Stable on Actigall for years. Followed by Dr. Brady at [facility]  

## 2015-03-22 NOTE — Progress Notes (Signed)
Subjective:    Patient ID: [name], female    DOB: 07-07-1940, 75 y.o.   MRN: [id number]  HPI   I have personally reviewed the Medicare Annual Wellness questionnaire and have noted 1. The patient's medical and social history 2. Their use of alcohol, tobacco or illicit drugs 3. Their current medications and supplements 4. The patient's functional ability including ADL's, fall risks, home safety risks and hearing or visual             impairment. 5. Diet and physical activities 6. Evidence for depression or mood disorders 7.         Updated provider list Cognitive evaluation was performed and recorded on pt medicare questionnaire form. The patients weight, height, BMI and visual acuity have been recorded in the chart  I have made referrals, counseling and provided education to the patient based review of the above and I have provided the pt with a written personalized care plan for preventive services.   She has recently been having left shoulder pain in 02/13/2015.   Hasbeen having decrease in motion and pain for years but much worse pain since 01/2105. Cannot  Int and ext rotation. She has tried aspirin for it. Has helped some.  No known injury.  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 Followed by Rob Hickman transplant MD at [facility] for liver transplant.  Hypertension:  Well controlled on HCTZ and lisinopril BP Readings from Last 3 Encounters:  03/22/15 132/64  12/16/14 116/64  Using medication without problems or lightheadedness:  Rarely  ( 2 times in last year) occ with low  BP feels sweaty. Saw cards about it.  Chest pain with exertion: None Edema:None Short of breath:None Average home BPs: Good Other issues:  Diabetes:  Well controlled on metformin  Secondary to liver and pancreas issues per pt.  Lab Results  Component Value Date   HGBA1C 7.0* 03/16/2015  Using medications  without difficulties:None Hypoglycemic episodes: None Hyperglycemic episodes:None Feet problems: none Blood Sugars averaging: FBS 97-125 eye exam within last year: yes   Wt Readings from Last 3 Encounters:  03/22/15 194 lb (87.998 kg)  12/16/14 196 lb 12.8 oz (89.268 kg)  09/15/14 197 lb (89.359 kg)     Elevated Cholesterol:  Well controlled on  no med. Lab Results  Component Value Date   CHOL 150 03/16/2015   HDL 51.30 03/16/2015   LDLCALC 85 03/16/2015   TRIG 68.0 03/16/2015   CHOLHDL 3 03/16/2015  Diet compliance: Moderate, working on low carb diet Exercise:  Minimal. Other complaints: Body mass index is 31.33 kg/(m^2).  GAD: Uses alprazolam once daily regularly. She has not used any other med for this.    Review of Systems  Constitutional: Negative for fever.  HENT: Negative for ear pain.        Allergies  Eyes: Negative for pain.  Respiratory: Negative for cough and shortness of breath.   Cardiovascular: Negative for chest pain.  Gastrointestinal: Negative for abdominal pain.  Musculoskeletal:       Left shoulder pain       Objective:   Physical Exam  Constitutional: Vital signs are normal. She appears well-developed and well-nourished. She is cooperative.  Non-toxic appearance. She does not appear ill. No distress.  Overweight   HENT:  Head: Normocephalic.  Right Ear: Hearing, tympanic membrane, external ear and ear canal normal. Tympanic membrane is not  erythematous, not retracted and not bulging.  Left Ear: Hearing, tympanic membrane, external ear and ear canal normal. Tympanic membrane is not erythematous, not retracted and not bulging.  Nose: Nose normal. No mucosal edema or rhinorrhea. Right sinus exhibits no maxillary sinus tenderness and no frontal sinus tenderness. Left sinus exhibits no maxillary sinus tenderness and no frontal sinus tenderness.  Mouth/Throat: Uvula is midline, oropharynx is clear and moist and mucous membranes are normal.  Eyes:  Conjunctivae, EOM and lids are normal. Pupils are equal, round, and reactive to light. Lids are everted and swept, no foreign bodies found.  Neck: Trachea normal and normal range of motion. Neck supple. Carotid bruit is not present. No thyroid mass and no thyromegaly present.  Cardiovascular: Normal rate, regular rhythm, S1 normal, S2 normal, normal heart sounds, intact distal pulses and normal pulses.  Exam reveals no gallop and no friction rub.   No murmur heard. Pulmonary/Chest: Effort normal and breath sounds normal. No tachypnea. No respiratory distress. She has no decreased breath sounds. She has no wheezes. She has no rhonchi. She has no rales.  Abdominal: Soft. Normal appearance and bowel sounds are normal. She exhibits no distension, no fluid wave, no abdominal bruit and no mass. There is no hepatosplenomegaly. There is no tenderness. There is no rebound, no guarding and no CVA tenderness. No hernia.  Musculoskeletal:       Right shoulder: She exhibits decreased range of motion, tenderness and bony tenderness.  Post acromial ttp  positive neer's  Lymphadenopathy:    She has no cervical adenopathy.    She has no axillary adenopathy.  Neurological: She is alert. She has normal strength. No cranial nerve deficit or sensory deficit.  Skin: Skin is warm, dry and intact. No rash noted.  Psychiatric: Her speech is normal and behavior is normal. Judgment and thought content normal. Her mood appears not anxious. Cognition and memory are normal. She does not exhibit a depressed mood.      Diabetic foot exam: Normal inspection No skin breakdown No calluses  Normal DP pulses Normal sensation to light touch and monofilament Nails normal     Assessment & Plan:  The patient's preventative maintenance and recommended screening tests for an annual wellness exam were reviewed in full today. Brought up to date unless services declined.  Counselled on the importance of diet, exercise, and its  role in overall health and mortality. The patient's FH and SH was reviewed, including their home life, tobacco status, and drug and alcohol status.    Vaccines: After 03/2015 will be due for PCV 23. Due for tdap but this is recommended against given liver issues., Uptodate with zoster and flu.  Colon: 2016, repeat due now.  Will plan stool test yearly. Mammo: 10/2014  DEXA: allergic to ca, overdue for eval.  She is not interested at this time.

## 2015-03-22 NOTE — Assessment & Plan Note (Signed)
Has living will and HCPOA. ( reviewed 2017)

## 2015-03-22 NOTE — Assessment & Plan Note (Signed)
Well controlled. Continue current medication.  

## 2015-03-22 NOTE — Assessment & Plan Note (Signed)
Return to ortho for re-eval.

## 2015-03-22 NOTE — Assessment & Plan Note (Signed)
Well controlled. Continue current medication. Encouraged exercise, weight loss, healthy eating habits.  

## 2015-03-29 DIAGNOSIS — M25512 Pain in left shoulder: Secondary | ICD-10-CM | POA: Diagnosis not present

## 2015-03-30 ENCOUNTER — Other Ambulatory Visit (INDEPENDENT_AMBULATORY_CARE_PROVIDER_SITE_OTHER): Payer: Medicare Other

## 2015-03-30 DIAGNOSIS — Z1211 Encounter for screening for malignant neoplasm of colon: Secondary | ICD-10-CM

## 2015-03-30 LAB — FECAL OCCULT BLOOD, IMMUNOCHEMICAL: FECAL OCCULT BLD: NEGATIVE

## 2015-04-05 NOTE — Addendum Note (Signed)
Addended by: Carter Kitten on: 04/05/2015 11:37 AM   Modules accepted: Medications

## 2015-04-20 ENCOUNTER — Other Ambulatory Visit: Payer: Self-pay | Admitting: Family Medicine

## 2015-05-05 ENCOUNTER — Other Ambulatory Visit: Payer: Self-pay | Admitting: Family Medicine

## 2015-05-09 ENCOUNTER — Other Ambulatory Visit: Payer: Self-pay | Admitting: *Deleted

## 2015-05-09 NOTE — Telephone Encounter (Signed)
Last office visit 03/22/2015.  Last refilled 03/22/2015 for #30.  Patient is requesting 90 day supply.  Ok to refill?

## 2015-05-10 MED ORDER — ALPRAZOLAM 0.25 MG PO TABS: 0.2500 mg | ORAL_TABLET | Freq: Every day | ORAL | Status: DC

## 2015-05-10 NOTE — Telephone Encounter (Signed)
Alprazolam called into [facility] [address].

## 2015-05-19 ENCOUNTER — Other Ambulatory Visit: Payer: Self-pay | Admitting: Family Medicine

## 2015-06-16 ENCOUNTER — Other Ambulatory Visit: Payer: Self-pay | Admitting: Family Medicine

## 2015-08-02 ENCOUNTER — Other Ambulatory Visit: Payer: Self-pay | Admitting: Family Medicine

## 2015-08-03 NOTE — Telephone Encounter (Signed)
Last office visit 03/22/2015.  Last refilled 05/10/2015 for #90 with no refills. Ok to refill?

## 2015-08-04 NOTE — Telephone Encounter (Signed)
Alprazolam called into [facility] [address].

## 2015-08-12 ENCOUNTER — Other Ambulatory Visit: Payer: Self-pay | Admitting: Family Medicine

## 2015-08-12 DIAGNOSIS — Z1231 Encounter for screening mammogram for malignant neoplasm of breast: Secondary | ICD-10-CM

## 2015-10-07 DIAGNOSIS — I10 Essential (primary) hypertension: Secondary | ICD-10-CM | POA: Diagnosis not present

## 2015-10-07 DIAGNOSIS — K1379 Other lesions of oral mucosa: Secondary | ICD-10-CM | POA: Diagnosis not present

## 2015-10-11 DIAGNOSIS — M25561 Pain in right knee: Secondary | ICD-10-CM | POA: Diagnosis not present

## 2015-10-11 DIAGNOSIS — M25562 Pain in left knee: Secondary | ICD-10-CM | POA: Diagnosis not present

## 2015-10-28 DIAGNOSIS — Z23 Encounter for immunization: Secondary | ICD-10-CM | POA: Diagnosis not present

## 2015-11-04 ENCOUNTER — Ambulatory Visit
Admission: RE | Admit: 2015-11-04 | Discharge: 2015-11-04 | Disposition: A | Discharge status: Home / Self Care | Payer: Medicare Other | Source: Ambulatory Visit | Attending: Family Medicine | Admitting: Family Medicine

## 2015-11-04 DIAGNOSIS — Z1231 Encounter for screening mammogram for malignant neoplasm of breast: Secondary | ICD-10-CM | POA: Diagnosis not present

## 2015-11-14 ENCOUNTER — Other Ambulatory Visit: Payer: Self-pay | Admitting: *Deleted

## 2015-11-14 NOTE — Telephone Encounter (Signed)
Last office visit 03/22/2015.  Last refilled 08/04/2015 for #90 with no refills.

## 2015-11-15 MED ORDER — ALPRAZOLAM 0.25 MG PO TABS: 0.2500 mg | ORAL_TABLET | Freq: Every day | ORAL | 0 refills | Status: DC

## 2015-11-15 NOTE — Telephone Encounter (Signed)
Alprazolam called into [facility] [address].

## 2015-11-23 DIAGNOSIS — K743 Primary biliary cirrhosis: Secondary | ICD-10-CM | POA: Diagnosis not present

## 2015-11-23 DIAGNOSIS — E119 Type 2 diabetes mellitus without complications: Secondary | ICD-10-CM | POA: Diagnosis not present

## 2016-01-24 ENCOUNTER — Other Ambulatory Visit: Payer: Self-pay | Admitting: Family Medicine

## 2016-01-25 NOTE — Telephone Encounter (Signed)
Ok to refill? Electronically refill request for   ALPRAZolam (XANAX) 0.25 MG tablet #90 with no refills  Last prescribed on 11/15/15. Last seen on 03/22/2015.

## 2016-01-27 NOTE — Telephone Encounter (Signed)
Left refill on voice mail at pharmacy  

## 2016-03-14 ENCOUNTER — Encounter: Payer: Self-pay | Admitting: Family Medicine

## 2016-03-16 DIAGNOSIS — E119 Type 2 diabetes mellitus without complications: Secondary | ICD-10-CM | POA: Diagnosis not present

## 2016-03-16 LAB — HM DIABETES EYE EXAM

## 2016-03-19 ENCOUNTER — Encounter: Payer: Self-pay | Admitting: Family Medicine

## 2016-04-20 ENCOUNTER — Other Ambulatory Visit: Payer: Medicare Other

## 2016-04-27 ENCOUNTER — Encounter: Payer: Medicare Other | Admitting: Family Medicine

## 2016-05-06 ENCOUNTER — Other Ambulatory Visit: Payer: Self-pay | Admitting: Family Medicine

## 2016-05-06 NOTE — Telephone Encounter (Signed)
Last office visit 03/22/2015.  CPE scheduled 05/15/16. Last refilled 12/31/15 for #90 with no refills.  Ok to refill?

## 2016-05-08 ENCOUNTER — Telehealth: Payer: Self-pay | Admitting: Family Medicine

## 2016-05-08 DIAGNOSIS — E7800 Pure hypercholesterolemia, unspecified: Secondary | ICD-10-CM

## 2016-05-08 DIAGNOSIS — E139 Other specified diabetes mellitus without complications: Secondary | ICD-10-CM

## 2016-05-08 NOTE — Telephone Encounter (Signed)
-----   Message from Ellamae Sia sent at 04/23/2016  3:34 PM EDT ----- Regarding: Lab orders for Wednesday, 4.11.18 Patient is scheduled for CPX labs, please order future labs, Thanks , Karna Christmas

## 2016-05-08 NOTE — Telephone Encounter (Signed)
Alprazolam called into [facility] - [place], [place] - [address] Phone: [phone] 

## 2016-05-09 ENCOUNTER — Other Ambulatory Visit (INDEPENDENT_AMBULATORY_CARE_PROVIDER_SITE_OTHER): Payer: Medicare Other

## 2016-05-09 DIAGNOSIS — E7800 Pure hypercholesterolemia, unspecified: Secondary | ICD-10-CM | POA: Diagnosis not present

## 2016-05-09 DIAGNOSIS — E139 Other specified diabetes mellitus without complications: Secondary | ICD-10-CM | POA: Diagnosis not present

## 2016-05-09 LAB — COMPREHENSIVE METABOLIC PANEL
ALK PHOS: 61 U/L (ref 39–117)
ALT: 10 U/L (ref 0–35)
AST: 16 U/L (ref 0–37)
Albumin: 4.1 g/dL (ref 3.5–5.2)
BUN: 27 mg/dL — ABNORMAL HIGH (ref 6–23)
CALCIUM: 9.6 mg/dL (ref 8.4–10.5)
CO2: 25 mEq/L (ref 19–32)
Chloride: 104 mEq/L (ref 96–112)
Creatinine, Ser: 0.85 mg/dL (ref 0.40–1.20)
GFR: 69.26 mL/min (ref >60.00)
GLUCOSE: 146 mg/dL — AB (ref 70–99)
POTASSIUM: 3.8 meq/L (ref 3.5–5.1)
Sodium: 137 mEq/L (ref 135–145)
TOTAL PROTEIN: 7.4 g/dL (ref 6.0–8.3)
Total Bilirubin: 0.4 mg/dL (ref 0.2–1.2)

## 2016-05-09 LAB — LIPID PANEL
CHOL/HDL RATIO: 3
Cholesterol: 152 mg/dL (ref 0–200)
HDL: 45.9 mg/dL (ref >39.00)
LDL Cholesterol: 82 mg/dL (ref 0–99)
NONHDL: 106.53
TRIGLYCERIDES: 122 mg/dL (ref 0.0–149.0)
VLDL: 24.4 mg/dL (ref 0.0–40.0)

## 2016-05-09 LAB — HEMOGLOBIN A1C: HEMOGLOBIN A1C: 7.2 % — AB (ref 4.6–6.5)

## 2016-05-15 ENCOUNTER — Encounter: Payer: Self-pay | Admitting: *Deleted

## 2016-05-15 ENCOUNTER — Ambulatory Visit (INDEPENDENT_AMBULATORY_CARE_PROVIDER_SITE_OTHER): Payer: Medicare Other | Admitting: Family Medicine

## 2016-05-15 ENCOUNTER — Encounter: Payer: Self-pay | Admitting: Family Medicine

## 2016-05-15 VITALS — BP 122/68 | HR 72 | Temp 97.5°F | Ht 65.0 in | Wt 195.2 lb

## 2016-05-15 DIAGNOSIS — Z23 Encounter for immunization: Secondary | ICD-10-CM

## 2016-05-15 DIAGNOSIS — K743 Primary biliary cirrhosis: Secondary | ICD-10-CM

## 2016-05-15 DIAGNOSIS — E7800 Pure hypercholesterolemia, unspecified: Secondary | ICD-10-CM | POA: Diagnosis not present

## 2016-05-15 DIAGNOSIS — Z8349 Family history of other endocrine, nutritional and metabolic diseases: Secondary | ICD-10-CM | POA: Diagnosis not present

## 2016-05-15 DIAGNOSIS — Z0000 Encounter for general adult medical examination without abnormal findings: Secondary | ICD-10-CM

## 2016-05-15 DIAGNOSIS — I10 Essential (primary) hypertension: Secondary | ICD-10-CM | POA: Diagnosis not present

## 2016-05-15 DIAGNOSIS — F411 Generalized anxiety disorder: Secondary | ICD-10-CM

## 2016-05-15 DIAGNOSIS — Z6832 Body mass index (BMI) 32.0-32.9, adult: Secondary | ICD-10-CM

## 2016-05-15 DIAGNOSIS — E139 Other specified diabetes mellitus without complications: Secondary | ICD-10-CM | POA: Diagnosis not present

## 2016-05-15 DIAGNOSIS — E6609 Other obesity due to excess calories: Secondary | ICD-10-CM | POA: Diagnosis not present

## 2016-05-15 DIAGNOSIS — Z1211 Encounter for screening for malignant neoplasm of colon: Secondary | ICD-10-CM | POA: Diagnosis not present

## 2016-05-15 LAB — HM DIABETES FOOT EXAM

## 2016-05-15 MED ORDER — FLUOXETINE HCL 20 MG PO TABS: 20.0000 mg | ORAL_TABLET | Freq: Every day | ORAL | 3 refills | Status: DC

## 2016-05-15 NOTE — Progress Notes (Signed)
Subjective:    Patient ID: [name], female    DOB: 11/06/1940, 76 y.o.   MRN: [id number]  HPI  76 year old female presents for her annual medicare wellness and follow up on multiple chronic issues. I have personally reviewed the Medicare Annual Wellness questionnaire and have noted 1. The patient's medical and social history 2. Their use of alcohol, tobacco or illicit drugs 3. Their current medications and supplements 4. The patient's functional ability including ADL's, fall risks, home safety risks and hearing or visual             impairment. 5. Diet and physical activities 6. Evidence for depression or mood disorders 7.         Updated provider list Cognitive evaluation was performed and recorded on pt medicare questionnaire form. The patients weight, height, BMI and visual acuity have been recorded in the chart  I have made referrals, counseling and provided education to the patient based review of the above and I have provided the pt with a written personalized care plan for preventive services.     She has been under a lot of stress lately..  Son has been diagnosed with alpha 1 antitrypsin deficiency, causing end stage emphysema.  Hypertension:   Well controlled on HCTZ and lisinopril.  Using medication without problems or lightheadedness:  None Chest pain with exertion: none Edema: off and on, wears compression hose prn Short of breath:None Average home BPs:  good Other issues:  Diabetes:   Worsened control on metformin, had to go back down to one a day given diarrhea SE. Lab Results  Component Value Date   HGBA1C 7.2 (H) 05/09/2016  Using medications without difficulties: Hypoglycemic episodes:none Hyperglycemic episodes:none Feet problems: none Blood Sugars averaging: 119-127 eye exam within last year: yes  Elevated Cholesterol:  Good control on no med. Lab Results  Component Value Date   CHOL 152 05/09/2016   HDL 45.90 05/09/2016   LDLCALC 82  05/09/2016   TRIG 122.0 05/09/2016   CHOLHDL 3 05/09/2016  Using medications without problems: Muscle aches:  Diet compliance: moderate Exercise: minimal Other complaints:  GAD: using alprazolam 0.25 mg daily prn anxiety. He husband reports that she has been more anxious lately, she is panicky at time.  Feels like alprazolam only helps a little.   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 Followed by Rob Hickman transplant MD at [facility] for liver transplant.  Social History /Family History/Past Medical History reviewed and updated if needed.  Blood pressure 122/68, pulse 72, temperature 97.5 F (36.4 C), temperature source Oral, height 5\' 5"  (1.651 m), weight 195 lb 4 oz (88.6 kg).  Review of Systems  Constitutional: Negative for fatigue and fever.  HENT: Negative for congestion and ear pain.   Eyes: Negative for pain.  Respiratory: Negative for cough, chest tightness and shortness of breath.   Cardiovascular: Negative for chest pain, palpitations and leg swelling.  Gastrointestinal: Negative for abdominal pain.  Genitourinary: Negative for dysuria and vaginal bleeding.  Musculoskeletal: Positive for arthralgias. Negative for back pain.  Neurological: Negative for syncope, light-headedness and headaches.  Psychiatric/Behavioral: Negative for dysphoric mood.       Objective:   Physical Exam  Constitutional: Vital signs are normal. She appears well-developed and well-nourished. She is cooperative.  Non-toxic appearance. She does not appear ill. No distress.  obese  HENT:  Head: Normocephalic.  Right Ear: Hearing, tympanic membrane, external ear and ear canal normal.  Left Ear:  Hearing, tympanic membrane, external ear and ear canal normal.  Nose: Nose normal.  Eyes: Conjunctivae, EOM and lids are normal. Pupils are equal, round, and reactive to light. Lids are everted and swept, no foreign bodies found.  Neck: Trachea normal and normal range of  motion. Neck supple. Carotid bruit is not present. No thyroid mass and no thyromegaly present.  Cardiovascular: Normal rate, regular rhythm, S1 normal, S2 normal, normal heart sounds and intact distal pulses.  Exam reveals no gallop.   No murmur heard. Pulmonary/Chest: Effort normal and breath sounds normal. No respiratory distress. She has no wheezes. She has no rhonchi. She has no rales.  Abdominal: Soft. Normal appearance and bowel sounds are normal. She exhibits no distension, no fluid wave, no abdominal bruit and no mass. There is no hepatosplenomegaly. There is no tenderness. There is no rebound, no guarding and no CVA tenderness. No hernia.  Lymphadenopathy:    She has no cervical adenopathy.    She has no axillary adenopathy.  Neurological: She is alert. She has normal strength. No cranial nerve deficit or sensory deficit.  Skin: Skin is warm, dry and intact. No rash noted.  Psychiatric: Her speech is normal and behavior is normal. Judgment normal. Her mood appears not anxious. Cognition and memory are normal. She does not exhibit a depressed mood.      Body mass index is 32.49 kg/m.   Diabetic foot exam: Normal inspection No skin breakdown No calluses  Normal DP pulses Normal sensation to light touch and monofilament Nails normal     Assessment & Plan:  The patient's preventative maintenance and recommended screening tests for an annual wellness exam were reviewed in full today. Brought up to date unless services declined.  Counselled on the importance of diet, exercise, and its role in overall health and mortality. The patient's FH and SH was reviewed, including their home life, tobacco status, and drug and alcohol status.    Advance directives: Has living will and HCPOA.. Not in chart, requested. Vaccines: Due for PCV 23. Due for tdap but this is recommended against given liver issues, Uptodate with zoster and flu.  Colon:   Olevia Perches, 07/2004 colonoscopy, Neg stool   hemeoccult  2017, plan  ifob.. Refuses cologuard. Mammo: 10/2015, not interested in repeating  DEXA: allergic to ca, overdue for eval.  She is not interested at this time.

## 2016-05-15 NOTE — Patient Instructions (Addendum)
Start prozac at, bedtime.  Use alprazolam as needed for anxiety, limit as able. Talk with liver MD about alpha 1 antitrypsin evaluation. Work on low [employer], increase walking as able.  Stop at lab on wway out for stool cards.

## 2016-05-15 NOTE — Assessment & Plan Note (Signed)
Worsened control due to worsening diet and decreased exercsie. Pt plans to get back on track. Re-eval in 3-6 months.

## 2016-05-15 NOTE — Assessment & Plan Note (Signed)
Encouraged exercise, weight loss, healthy eating habits. ? ?

## 2016-05-15 NOTE — Assessment & Plan Note (Signed)
No clear need for genetic testing in patient, but encouraged other son and grandkids to be tested. 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

## 2016-05-15 NOTE — Assessment & Plan Note (Signed)
Followed by [facility].  Pt will discuss with D.r Loletha Grayer whether need for alpha 1 antitrypsin def testing is present.  Can be linked with cirrhosis, but not PBC.

## 2016-05-15 NOTE — Assessment & Plan Note (Signed)
Well controlled. Continue current medication.  

## 2016-05-15 NOTE — Assessment & Plan Note (Signed)
Poor control. Stsart SSRI, decrease use of high risk med like alprazolam.  Follow up in 1 month for re-eval.

## 2016-05-15 NOTE — Progress Notes (Signed)
Pre visit review using our clinic review tool, if applicable. No additional management support is needed unless otherwise documented below in the visit note. 

## 2016-05-15 NOTE — Assessment & Plan Note (Signed)
Adequate control on no medication.

## 2016-05-20 ENCOUNTER — Other Ambulatory Visit: Payer: Self-pay | Admitting: Family Medicine

## 2016-05-31 ENCOUNTER — Other Ambulatory Visit (INDEPENDENT_AMBULATORY_CARE_PROVIDER_SITE_OTHER): Payer: Medicare Other

## 2016-05-31 DIAGNOSIS — Z1211 Encounter for screening for malignant neoplasm of colon: Secondary | ICD-10-CM

## 2016-05-31 LAB — FECAL OCCULT BLOOD, IMMUNOCHEMICAL: Fecal Occult Bld: NEGATIVE

## 2016-06-01 DIAGNOSIS — H43811 Vitreous degeneration, right eye: Secondary | ICD-10-CM | POA: Diagnosis not present

## 2016-06-07 ENCOUNTER — Telehealth: Payer: Self-pay

## 2016-06-07 ENCOUNTER — Ambulatory Visit (INDEPENDENT_AMBULATORY_CARE_PROVIDER_SITE_OTHER): Payer: Medicare Other | Admitting: Internal Medicine

## 2016-06-07 ENCOUNTER — Encounter: Payer: Self-pay | Admitting: Internal Medicine

## 2016-06-07 VITALS — BP 138/84 | HR 78 | Temp 97.8°F | Wt 193.5 lb

## 2016-06-07 DIAGNOSIS — T887XXA Unspecified adverse effect of drug or medicament, initial encounter: Secondary | ICD-10-CM | POA: Diagnosis not present

## 2016-06-07 DIAGNOSIS — F411 Generalized anxiety disorder: Secondary | ICD-10-CM

## 2016-06-07 NOTE — Progress Notes (Signed)
Subjective:    Patient ID: [name], female    DOB: 11-08-1940, 76 y.o.   MRN: [id number]  HPI  Pt presents to the clinic today with c/o of feeling lightheaded, dizzy, nauseas and feeling like her hear is racing. She reports these symptoms started on Sunday. She describes the lightheadedness as a sensation that she is imbalanced, not that the room is spinning. She denies vomiting, chest pain or shortness of breath. She started taking Prozac on Saturday and thinks this is what is causing her symptoms. She has never taken anything like Prozac, only Xanax and Valium in the past. She reports she was started on Prozac because her PCP did not want her to take Xanax due to her age and its addictive properties. She has not taken the Xanax since she started the Prozac, but she is concerned that she can not tolerate the Prozac due to the side effects. Her last dose of the Prozac was Tuesday and she started taking the Xanax again yesterday. She has a follow up with her PCP on Tuesday.   Review of Systems      Past Medical History:  Diagnosis Date  . Diabetes mellitus ([place])   . Environmental allergies   . Hypertension   . Liver failure ([place])   . Pancreatitis     Current Outpatient Prescriptions  Medication Sig Dispense Refill  . acetaminophen (TYLENOL) 500 MG tablet Take 500 mg by mouth every 6 (six) hours as needed.    . ALPRAZolam (XANAX) 0.25 MG tablet TAKE ONE TABLET BY MOUTH DAILY 90 tablet 0  . cetirizine (ZYRTEC) 10 MG tablet Take 10 mg by mouth daily.    Marland Kitchen glucose blood test strip Free Style Test Strips-Use to check blood sugar two times a day. E11.9 (Patient taking differently: Free Style Test Strips-Use to check blood sugar one time a day. E11.9) 100 each 5  . hydrochlorothiazide (HYDRODIURIL) 25 MG tablet Take 25 mg by mouth daily.    Marland Kitchen ibuprofen (ADVIL,MOTRIN) 200 MG tablet Take 200 mg by mouth every 6 (six) hours as needed.    Marland Kitchen lisinopril (PRINIVIL,ZESTRIL) 10 MG tablet Take 10  mg at the evening and 5 mg in the morning    . lisinopril (PRINIVIL,ZESTRIL) 5 MG tablet Take 5 mg by mouth every morning.   2  . metFORMIN (GLUCOPHAGE) 500 MG tablet TAKE 1 TABLET (500 MG TOTAL) BY MOUTH 2 (TWO) TIMES DAILY WITH A MEAL. 180 tablet 3  . ranitidine (ZANTAC) 150 MG tablet Take 150 mg by mouth 2 (two) times daily.    . ursodiol (ACTIGALL) 300 MG capsule Take 300 mg by mouth 2 (two) times daily. 2 in am; 1 in pm    . FLUoxetine (PROZAC) 20 MG tablet Take 1 tablet (20 mg total) by mouth daily. (Patient not taking: Reported on 06/07/2016) 90 tablet 3   No current facility-administered medications for this visit.     Allergies  Allergen Reactions  . Codeine Anaphylaxis    Bad respiratory problems  . Demerol [Meperidine] Anaphylaxis    She thinks it has codeine in it   . Talwin [Pentazocine] Anaphylaxis    Affects liver and respiratory problems  . Tessalon [Benzonatate] Anaphylaxis    Trachea will get so inflammed  . Vicodin [Hydrocodone-Acetaminophen] Anaphylaxis    Major respiratory  . Betadine [Povidone Iodine]     Skin sloughes off  . Tape   . Calcium Channel Blockers Rash    Double vision and rash;  cant even take CA2+ pills  . Iodine Rash    Rash and respiratory  . Neosporin [Neomycin-Bacitracin Zn-Polymyx] Rash  . Sulfa Antibiotics Rash    Family History  Problem Relation Age of Onset  . Colon cancer Neg Hx   . Colon polyps Neg Hx     Social History   Social History  . Marital status: Married    Spouse name: N/A  . Number of children: N/A  . Years of education: N/A   Occupational History  . Not on file.   Social History Main Topics  . Smoking status: Never Smoker  . Smokeless tobacco: Never Used  . Alcohol use 0.0 oz/week     Comment: 4oz wine 2 times yearly  . Drug use: No  . Sexual activity: Not on file   Other Topics Concern  . Not on file   Social History Narrative  . No narrative on file     Constitutional: Pt reports headache.  Denies fever, malaise, fatigue, or abrupt weight changes.  Respiratory: Denies difficulty breathing, shortness of breath, cough or sputum production.   Cardiovascular: Pt reports palpitations. Denies chest pain, chest tightness, or swelling in the hands or feet.  Gastrointestinal: Pt reports nausea Denies abdominal pain, bloating, constipation, diarrhea or blood in the stool.  Neurological: Pt reports dizziness. Denies difficulty with memory, difficulty with speech or problems with balance and coordination.  Psych: Pt reports anxiety. Denies depression, SI/HI.  No other specific complaints in a complete review of systems (except as listed in HPI above).  Objective:   Physical Exam   BP 138/84 (BP Location: Right Arm, Patient Position: Sitting, Cuff Size: Large)   Pulse 78   Temp 97.8 F (36.6 C) (Oral)   Wt 193 lb 8 oz (87.8 kg)   SpO2 98%   BMI 32.20 kg/m  Wt Readings from Last 3 Encounters:  06/07/16 193 lb 8 oz (87.8 kg)  05/15/16 195 lb 4 oz (88.6 kg)  03/22/15 194 lb (88 kg)    General: Appears her stated age, obese in NAD. Cardiovascular: Normal rate and rhythm. S1,S2 noted.  Pulmonary/Chest: Normal effort and positive vesicular breath sounds. No respiratory distress. No wheezes, rales or ronchi noted.  Abdomen: Soft and nontender. Normal bowel sounds.  Neurological: Alert and oriented.  Psychiatric: She is anxious appearing today. Judgment and thought content normal.    BMET    Component Value Date/Time   NA 137 05/09/2016 0904   K 3.8 05/09/2016 0904   CL 104 05/09/2016 0904   CO2 25 05/09/2016 0904   GLUCOSE 146 (H) 05/09/2016 0904   BUN 27 (H) 05/09/2016 0904   CREATININE 0.85 05/09/2016 0904   CALCIUM 9.6 05/09/2016 0904    Lipid Panel     Component Value Date/Time   CHOL 152 05/09/2016 0904   TRIG 122.0 05/09/2016 0904   HDL 45.90 05/09/2016 0904   CHOLHDL 3 05/09/2016 0904   VLDL 24.4 05/09/2016 0904   LDLCALC 82 05/09/2016 0904    CBC No  results found for: WBC, RBC, HGB, HCT, PLT, MCV, MCH, MCHC, RDW, LYMPHSABS, MONOABS, EOSABS, BASOSABS  Hgb A1C Lab Results  Component Value Date   HGBA1C 7.2 (H) 05/09/2016           Assessment & Plan:   Medication Side Effect,GAD:  Advised her if she could continue the medication for a few more days, the side effects would likely subside, but she declines Offered to decrease dose to 10 mg daily, she  declines " I am not taking this anymore, I just want my Xanax) Advised her I was not going to refill her Xanax if her PCP had desired to transition her to SSRI therapy She will follow up with PCP next week  Return precautions discussed Webb Silversmith, NP

## 2016-06-07 NOTE — Progress Notes (Signed)
Pre visit review using our clinic review tool, if applicable. No additional management support is needed unless otherwise documented below in the visit note. 

## 2016-06-07 NOTE — Patient Instructions (Signed)
Generalized Anxiety Disorder, Adult Generalized anxiety disorder (GAD) is a mental health disorder. People with this condition constantly worry about everyday events. Unlike normal anxiety, worry related to GAD is not triggered by a specific event. These worries also do not fade or get better with time. GAD interferes with life functions, including relationships, work, and school. GAD can vary from mild to severe. People with severe GAD can have intense waves of anxiety with physical symptoms (panic attacks). What are the causes? The exact cause of GAD is not known. What increases the risk? This condition is more likely to develop in:  Women.  People who have a family history of anxiety disorders.  People who are very shy.  People who experience very stressful life events, such as the death of a loved one.  People who have a very stressful family environment. What are the signs or symptoms? People with GAD often worry excessively about many things in their lives, such as their health and family. They may also be overly concerned about:  Doing well at work.  Being on time.  Natural disasters.  Friendships. Physical symptoms of GAD include:  Fatigue.  Muscle tension or having muscle twitches.  Trembling or feeling shaky.  Being easily startled.  Feeling like your heart is pounding or racing.  Feeling out of breath or like you cannot take a deep breath.  Having trouble falling asleep or staying asleep.  Sweating.  Nausea, diarrhea, or irritable bowel syndrome (IBS).  Headaches.  Trouble concentrating or remembering facts.  Restlessness.  Irritability. How is this diagnosed? Your health care provider can diagnose GAD based on your symptoms and medical history. You will also have a physical exam. The health care provider will ask specific questions about your symptoms, including how severe they are, when they started, and if they come and go. Your health care  provider may ask you about your use of alcohol or drugs, including prescription medicines. Your health care provider may refer you to a mental health specialist for further evaluation. Your health care provider will do a thorough examination and may perform additional tests to rule out other possible causes of your symptoms. To be diagnosed with GAD, a person must have anxiety that:  Is out of his or her control.  Affects several different aspects of his or her life, such as work and relationships.  Causes distress that makes him or her unable to take part in normal activities.  Includes at least three physical symptoms of GAD, such as restlessness, fatigue, trouble concentrating, irritability, muscle tension, or sleep problems. Before your health care provider can confirm a diagnosis of GAD, these symptoms must be present more days than they are not, and they must last for six months or longer. How is this treated? The following therapies are usually used to treat GAD:  Medicine. Antidepressant medicine is usually prescribed for long-term daily control. Antianxiety medicines may be added in severe cases, especially when panic attacks occur.  Talk therapy (psychotherapy). Certain types of talk therapy can be helpful in treating GAD by providing support, education, and guidance. Options include:  Cognitive behavioral therapy (CBT). People learn coping skills and techniques to ease their anxiety. They learn to identify unrealistic or negative thoughts and behaviors and to replace them with positive ones.  Acceptance and commitment therapy (ACT). This treatment teaches people how to be mindful as a way to cope with unwanted thoughts and feelings.  Biofeedback. This process trains you to manage your body's response (  physiological response) through breathing techniques and relaxation methods. You will work with a therapist while machines are used to monitor your physical symptoms.  Stress  management techniques. These include yoga, meditation, and exercise. A mental health specialist can help determine which treatment is best for you. Some people see improvement with one type of therapy. However, other people require a combination of therapies. Follow these instructions at home:  Take over-the-counter and prescription medicines only as told by your health care provider.  Try to maintain a normal routine.  Try to anticipate stressful situations and allow extra time to manage them.  Practice any stress management or self-calming techniques as taught by your health care provider.  Do not punish yourself for setbacks or for not making progress.  Try to recognize your accomplishments, even if they are small.  Keep all follow-up visits as told by your health care provider. This is important. Contact a health care provider if:  Your symptoms do not get better.  Your symptoms get worse.  You have signs of depression, such as:  A persistently sad, cranky, or irritable mood.  Loss of enjoyment in activities that used to bring you joy.  Change in weight or eating.  Changes in sleeping habits.  Avoiding friends or family members.  Loss of energy for normal tasks.  Feelings of guilt or worthlessness. Get help right away if:  You have serious thoughts about hurting yourself or others. If you ever feel like you may hurt yourself or others, or have thoughts about taking your own life, get help right away. You can go to your nearest emergency department or call:  Your local emergency services (911 in the [place]  A suicide crisis helpline, such as the National Suicide Prevention Lifeline at 1-800-273-8255. This is open 24 hours a day. Summary  Generalized anxiety disorder (GAD) is a mental health disorder that involves worry that is not triggered by a specific event.  People with GAD often worry excessively about many things in their lives, such as their health and  family.  GAD may cause physical symptoms such as restlessness, trouble concentrating, sleep problems, frequent sweating, nausea, diarrhea, headaches, and trembling or muscle twitching.  A mental health specialist can help determine which treatment is best for you. Some people see improvement with one type of therapy. However, other people require a combination of therapies. This information is not intended to replace advice given to you by your health care provider. Make sure you discuss any questions you have with your health care provider. Document Released: 05/12/2012 Document Revised: 12/06/2015 Document Reviewed: 12/06/2015 Elsevier Interactive Patient Education  2017 [employer]vier Inc.  

## 2016-06-07 NOTE — Telephone Encounter (Signed)
Pt came with husband for labs but mentioned at front desk pt having frequent episodes of dizziness since 06/03/16;pt started prozac on 06/02/16. On 06/03/16 when would eat anything feels like food sitting mid chest; no CP or SOB. When pt exerts herself feels like heart pounding. Now sitting no problem with heart pounding.H/A pain level 2-3 but pt said she tolerates pain very well. Last seek seen by eye dr for ? Floaters;eye dr could not tell pt cause; pt not having problem with eye floaters today but wanted Wendy Echevaria NP to be aware. Pt does not seem in any distress. Pt has appt to see Wendy Echevaria NP.BP 138/84ge cuff rt arm; 98% pulse ox and 78 pulse.

## 2016-06-12 ENCOUNTER — Encounter: Payer: Self-pay | Admitting: Family Medicine

## 2016-06-12 ENCOUNTER — Ambulatory Visit (INDEPENDENT_AMBULATORY_CARE_PROVIDER_SITE_OTHER): Payer: Medicare Other | Admitting: Family Medicine

## 2016-06-12 DIAGNOSIS — F411 Generalized anxiety disorder: Secondary | ICD-10-CM | POA: Diagnosis not present

## 2016-06-12 MED ORDER — FREESTYLE LANCETS MISC: 12 refills | Status: DC

## 2016-06-12 MED ORDER — GLUCOSE BLOOD VI STRP: ORAL_STRIP | 5 refills | Status: DC

## 2016-06-12 NOTE — Assessment & Plan Note (Signed)
Did not tolerate prozac and refuses other SSRI.. Improved symptoms overall.  Encouraged pt to limit xanax to as needed gfiven age and risk. Encouraged her to use only if really needing.

## 2016-06-12 NOTE — Progress Notes (Addendum)
   Subjective:    Patient ID: [name], female    DOB: 1940-03-07, 76 y.o.   MRN: [id number]  HPI 76 year old female presents for follow up mood.   At last OV 4 weeks ago.. Started on prozac but felt she had SE .Marland Kitchencaused nightmares, headaches, insomnia,after eating caused nausea and food in stomach, heart racing. She had symptoms  3 days after starting.   She has now restarted xanax daily at night.  She feels mood is doing better overall.  She did have new stressor with grandaughter being sick.  Sleeping well at night now. PHQ9: 2     Review of Systems  Constitutional: Negative for fatigue and fever.  HENT: Negative for ear pain.   Eyes: Negative for pain.  Respiratory: Negative for chest tightness and shortness of breath.   Cardiovascular: Negative for chest pain, palpitations and leg swelling.  Gastrointestinal: Negative for abdominal pain.  Genitourinary: Negative for dysuria.       Objective:   Physical Exam  Constitutional: Vital signs are normal. She appears well-developed and well-nourished. She is cooperative.  Non-toxic appearance. She does not appear ill. No distress.  HENT:  Head: Normocephalic.  Right Ear: Hearing, tympanic membrane, external ear and ear canal normal. Tympanic membrane is not erythematous, not retracted and not bulging.  Left Ear: Hearing, tympanic membrane, external ear and ear canal normal. Tympanic membrane is not erythematous, not retracted and not bulging.  Nose: No mucosal edema or rhinorrhea. Right sinus exhibits no maxillary sinus tenderness and no frontal sinus tenderness. Left sinus exhibits no maxillary sinus tenderness and no frontal sinus tenderness.  Mouth/Throat: Uvula is midline, oropharynx is clear and moist and mucous membranes are normal.  Eyes: Conjunctivae, EOM and lids are normal. Pupils are equal, round, and reactive to light. Lids are everted and swept, no foreign bodies found.  Neck: Trachea normal and normal range of  motion. Neck supple. Carotid bruit is not present. No thyroid mass and no thyromegaly present.  Cardiovascular: Normal rate, regular rhythm, S1 normal, S2 normal, normal heart sounds, intact distal pulses and normal pulses.  Exam reveals no gallop and no friction rub.   No murmur heard. Pulmonary/Chest: Effort normal and breath sounds normal. No tachypnea. No respiratory distress. She has no decreased breath sounds. She has no wheezes. She has no rhonchi. She has no rales.  Abdominal: Soft. Normal appearance and bowel sounds are normal. There is no tenderness.  Neurological: She is alert.  Skin: Skin is warm, dry and intact. No rash noted.  Psychiatric: Her speech is normal and behavior is normal. Judgment and thought content normal. Her mood appears not anxious. Cognition and memory are normal. She does not exhibit a depressed mood.          Assessment & Plan:

## 2016-06-12 NOTE — Patient Instructions (Signed)
Work on trying to think about alprazolam as needed only.  If having a good day, you do not need to use it.

## 2016-06-26 ENCOUNTER — Telehealth: Payer: Self-pay | Admitting: Family Medicine

## 2016-06-26 NOTE — Telephone Encounter (Signed)
Appointment scheduled with Dr. Glori Bickers on 06/27/2016 at 3:15 pm.

## 2016-06-26 NOTE — Telephone Encounter (Signed)
Call pt.. Needs to be seen

## 2016-06-26 NOTE — Telephone Encounter (Signed)
Patient Name: [name] DOB: Jul 16, 1940 Initial Comment Caller states has a blister on her ankle, very itchy Nurse Assessment Nurse: Sherrell Puller, RN, Amy Date/Time (Eastern Time): 06/26/2016 11:16:22 AM Confirm and document reason for call. If symptomatic, describe symptoms. ---Caller states she has a blister on inside of her left ankle that is very itchy and is painful as well. She said it started out small last Thursday and keeps getting bigger. Says she was in the yard last Wednesday cutting bushes and noticed a red spot on her ankle. No redness or swelling around the blister now but says it looks like there is yellow fluid or pus in the sore. No fever. Does the patient have any new or worsening symptoms? ---Yes Will a triage be completed? ---Yes Related visit to physician within the last 2 weeks? ---No Does the PT have any chronic conditions? (i.e. diabetes, asthma, etc.) ---Yes List chronic conditions. ---Diabetes, Cirrhosis Is this a behavioral health or substance abuse call? ---No Guidelines Guideline Title Affirmed Question Affirmed Notes Blister - Foot and Hand Looks like a boil or deep ulcer Final Disposition User See Physician within 4 Hours (or PCP triage) Sherrell Puller, RN, Amy Referrals REFERRED TO PCP OFFICE Disagree/Comply: Comply No appt available with PCP or at primary office. Caller did not want to be seen anywhere else. She would like to leave a message with the doctor to see about being seen at next available appt time.

## 2016-06-27 ENCOUNTER — Encounter: Payer: Self-pay | Admitting: Family Medicine

## 2016-06-27 ENCOUNTER — Ambulatory Visit (INDEPENDENT_AMBULATORY_CARE_PROVIDER_SITE_OTHER): Payer: Medicare Other | Admitting: Family Medicine

## 2016-06-27 VITALS — BP 124/66 | HR 73 | Temp 98.4°F | Ht 65.0 in | Wt 189.5 lb

## 2016-06-27 DIAGNOSIS — T148XXA Other injury of unspecified body region, initial encounter: Secondary | ICD-10-CM | POA: Diagnosis not present

## 2016-06-27 MED ORDER — MOMETASONE FUROATE 0.1 % EX CREA: 1.0000 "application " | TOPICAL_CREAM | Freq: Every day | CUTANEOUS | 0 refills | Status: DC

## 2016-06-27 NOTE — Progress Notes (Signed)
Subjective:    Patient ID: [name], female    DOB: 1940/02/25, 76 y.o.   MRN: [id number]  HPI  Here for a blister on the inside of her left ankle   Noticed it last Wednesday  She had been trimming rose bushes- ? If it could have been from an insect bite Started as an itchy red spot  She put listerine on it to help itching  Then next am - small blister (less than 1 cm)- got bigger through the day  She covered with gauze/non stick   (loose) It was painful and itchy  No drainage /it has not popped  It is now softer than it was- getting a little better   She tried some hydrocortisone cream and then abx oint  Now not using anything  No other areas   No poison ivy anywhere   No ticks found   No hx of pemphigoid She has autoimmune primary biliary cirrhosis  A lot of auto immune dz in family - but no pemphigoid   Patient Active Problem List   Diagnosis Date Noted  . Blister 06/27/2016  . Family history of alpha 1 antitrypsin deficiency 05/15/2016  . Medicare annual wellness visit, subsequent 03/22/2015  . Obesity 03/22/2015  . Advanced care planning/counseling discussion 03/22/2015  . Shoulder pain, left 03/22/2015  . DM (diabetes mellitus), secondary ([place]) 12/16/2014  . History of pancreatitis 12/16/2014  . 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 Essential hypertension, benign 12/16/2014  . GERD (gastroesophageal reflux disease) 12/16/2014  . Allergic rhinitis 12/16/2014  . Generalized anxiety disorder 12/16/2014  . Hyperlipidemia 12/16/2014  . Bilateral tinnitus 12/16/2014   Past Medical History:  Diagnosis Date  . Diabetes mellitus ([place])   . Environmental allergies   . Hypertension   . Liver failure ([place])   . Pancreatitis    Past Surgical History:  Procedure Laterality Date  . CARPAL TUNNEL RELEASE  8/91  . CHOLECYSTECTOMY  10/80  . COLONOSCOPY    . ERCP  3/95  . fusion on fingers  2000 2001  . INCONTINENCE SURGERY  09/17/00  . KNEE  ARTHROPLASTY  10/25/95   x2  . KNEE ARTHROSCOPY  7/92, 93, 94   x3  . LIVER BIOPSY  5/95  . perforated colon  3/07  . SHOULDER ARTHROSCOPY  3/92   spur cut muscle  . sigmoid colon surgery  07/17/05   Social History  Substance Use Topics  . Smoking status: Never Smoker  . Smokeless tobacco: Never Used  . Alcohol use 0.0 oz/week     Comment: 4oz wine 2 times yearly   Family History  Problem Relation Age of Onset  . Colon cancer Neg Hx   . Colon polyps Neg Hx    Allergies  Allergen Reactions  . Codeine Anaphylaxis    Bad respiratory problems  . Demerol [Meperidine] Anaphylaxis    She thinks it has codeine in it   . Talwin [Pentazocine] Anaphylaxis    Affects liver and respiratory problems  . Tessalon [Benzonatate] Anaphylaxis    Trachea will get so inflammed  . Vicodin [Hydrocodone-Acetaminophen] Anaphylaxis    Major respiratory  . Betadine [Povidone Iodine]     Skin sloughes off  . Tape   . Calcium Channel Blockers Rash    Double vision and rash; cant even take CA2+ pills  . Iodine Rash    Rash and respiratory  . Neosporin [Neomycin-Bacitracin Zn-Polymyx] Rash  . Sulfa Antibiotics Rash   Current Outpatient Prescriptions  on File Prior to Visit  Medication Sig Dispense Refill  . acetaminophen (TYLENOL) 500 MG tablet Take 500 mg by mouth 3 times/day as needed-between meals & bedtime.     . ALPRAZolam (XANAX) 0.25 MG tablet TAKE ONE TABLET BY MOUTH DAILY (Patient taking differently: TAKE ONE TABLET BY MOUTH DAILY prn anxiety) 90 tablet 0  . cetirizine (ZYRTEC) 10 MG tablet Take 10 mg by mouth daily.    Marland Kitchen glucose blood test strip Free Style Test Strips-Use to check blood sugar one time a day. E11.9 100 each 5  . hydrochlorothiazide (HYDRODIURIL) 25 MG tablet Take 25 mg by mouth daily.    Marland Kitchen ibuprofen (ADVIL,MOTRIN) 200 MG tablet Take 200 mg by mouth as needed.     . Lancets (FREESTYLE) lancets Free Style Test Strips-Use to check blood sugar one time a day. E11.9 100 each 12   . lisinopril (PRINIVIL,ZESTRIL) 10 MG tablet Take 10 mg at the evening and 5 mg in the morning    . lisinopril (PRINIVIL,ZESTRIL) 5 MG tablet Take 5 mg by mouth every morning.   2  . metFORMIN (GLUCOPHAGE) 500 MG tablet TAKE 1 TABLET (500 MG TOTAL) BY MOUTH 2 (TWO) TIMES DAILY WITH A MEAL. 180 tablet 3  . ranitidine (ZANTAC) 150 MG tablet Take 150 mg by mouth 2 (two) times daily.    . ursodiol (ACTIGALL) 300 MG capsule Take 300 mg by mouth 2 (two) times daily. 2 in am; 1 in pm     No current facility-administered medications on file prior to visit.     Review of Systems    Review of Systems  Constitutional: Negative for fever, appetite change, fatigue and unexpected weight change.  Eyes: Negative for pain and visual disturbance.  Respiratory: Negative for cough and shortness of breath.   Cardiovascular: Negative for cp or palpitations    Gastrointestinal: Negative for nausea, diarrhea and constipation.  Genitourinary: Negative for urgency and frequency.  Skin: Negative for pallor or rash    Pos for itchy blister on L ankle  Neurological: Negative for weakness, light-headedness, numbness and headaches.  Hematological: Negative for adenopathy. Does not bruise/bleed easily.  Psychiatric/Behavioral: Negative for dysphoric mood. The patient is not nervous/anxious.      Objective:   Physical Exam  Constitutional: She appears well-developed and well-nourished. No distress.  obese and well appearing   HENT:  Head: Normocephalic and atraumatic.  Neck: Normal range of motion. Neck supple.  Cardiovascular: Normal rate and regular rhythm.   Pulmonary/Chest: Effort normal and breath sounds normal. She has no wheezes.  Lymphadenopathy:    She has no cervical adenopathy.  Skin: Skin is warm and dry.  2 cm oval blister containing straw colored fluid on L medial ankle  No other signs of rash or trauma  Scant redness Lanced and fluid drained (top kept on) -with some relief Covered loosely with  non stick dressing- inst given   Small (2-3 mm) papule/vesicle on R dorsal foot-pt unsure how long that has been there   No other skin findings No ticks  Psychiatric: She has a normal mood and affect.          Assessment & Plan:   Problem List Items Addressed This Visit      Other   Blister    Medial L ankle  Differential incl traumatic/allergic or less likely bullous pemphigoid  Lanced and fluid drained for comfort  (top left on) Will tx with mometasone cream for itch Watch for s/s of infection/soap  and water cleanse and loose cover  If not resolved in 2 wk or worse at any time-would ref to derm

## 2016-06-27 NOTE — Patient Instructions (Signed)
Keep blister clean with soap and water  Cover lightly  Let it heal by itself  Use mometasone cream -tiny amount once daily for itching Watch for rash/ increase in size or redness or pain or itching and alert [place]   Update if not starting to improve in a week or if worsening  - let us know and we will begin dermatology referral process

## 2016-06-28 NOTE — Assessment & Plan Note (Signed)
Medial L ankle  Differential incl traumatic/allergic or less likely bullous pemphigoid  Lanced and fluid drained for comfort  (top left on) Will tx with mometasone cream for itch Watch for s/s of infection/soap and water cleanse and loose cover  If not resolved in 2 wk or worse at any time-would ref to derm

## 2016-07-06 DIAGNOSIS — S90562A Insect bite (nonvenomous), left ankle, initial encounter: Secondary | ICD-10-CM | POA: Diagnosis not present

## 2016-08-07 ENCOUNTER — Other Ambulatory Visit: Payer: Medicare Other

## 2016-08-14 ENCOUNTER — Other Ambulatory Visit: Payer: Self-pay | Admitting: Family Medicine

## 2016-08-14 ENCOUNTER — Ambulatory Visit: Payer: Medicare Other | Admitting: Family Medicine

## 2016-08-14 NOTE — Telephone Encounter (Signed)
Last office visit 06/27/2016 with Dr. Glori Bickers for blister.  Last refilled 05/08/2016 for #90 with no refills.  Ok to refill?

## 2016-08-14 NOTE — Telephone Encounter (Signed)
Alprazolam called into [facility] - [place], [place] - [address] Phone: [phone] 

## 2016-08-14 NOTE — Telephone Encounter (Signed)
Ok to ref 49, 0 ref

## 2016-09-19 ENCOUNTER — Other Ambulatory Visit: Payer: Self-pay | Admitting: Family Medicine

## 2016-09-19 DIAGNOSIS — Z1231 Encounter for screening mammogram for malignant neoplasm of breast: Secondary | ICD-10-CM

## 2016-10-02 DIAGNOSIS — M25562 Pain in left knee: Secondary | ICD-10-CM | POA: Diagnosis not present

## 2016-10-02 DIAGNOSIS — M25561 Pain in right knee: Secondary | ICD-10-CM | POA: Diagnosis not present

## 2016-10-05 DIAGNOSIS — I10 Essential (primary) hypertension: Secondary | ICD-10-CM | POA: Diagnosis not present

## 2016-10-18 DIAGNOSIS — Z23 Encounter for immunization: Secondary | ICD-10-CM | POA: Diagnosis not present

## 2016-11-02 ENCOUNTER — Ambulatory Visit
Admission: RE | Admit: 2016-11-02 | Discharge: 2016-11-02 | Disposition: A | Discharge status: Home / Self Care | Payer: Medicare Other | Source: Ambulatory Visit | Attending: Family Medicine | Admitting: Family Medicine

## 2016-11-02 DIAGNOSIS — Z1231 Encounter for screening mammogram for malignant neoplasm of breast: Secondary | ICD-10-CM | POA: Diagnosis not present

## 2016-11-06 ENCOUNTER — Other Ambulatory Visit: Payer: Self-pay | Admitting: Family Medicine

## 2016-11-06 ENCOUNTER — Telehealth: Payer: Self-pay | Admitting: Family Medicine

## 2016-11-06 DIAGNOSIS — R928 Other abnormal and inconclusive findings on diagnostic imaging of breast: Secondary | ICD-10-CM

## 2016-11-06 NOTE — Telephone Encounter (Signed)
Juliann Pulse at [facility] called regarding orders for PT. The orders need to be changed and they request you co-sign orders in epic so they can get PT scheduled. Juliann Pulse said they may need to fax over written, she wasn't sure at this time.

## 2016-11-07 ENCOUNTER — Ambulatory Visit
Admission: RE | Admit: 2016-11-07 | Discharge: 2016-11-07 | Disposition: A | Discharge status: Home / Self Care | Payer: Medicare Other | Source: Ambulatory Visit | Attending: Family Medicine | Admitting: Family Medicine

## 2016-11-07 DIAGNOSIS — R928 Other abnormal and inconclusive findings on diagnostic imaging of breast: Secondary | ICD-10-CM

## 2016-11-07 DIAGNOSIS — N6489 Other specified disorders of breast: Secondary | ICD-10-CM | POA: Diagnosis not present

## 2016-11-21 DIAGNOSIS — E669 Obesity, unspecified: Secondary | ICD-10-CM | POA: Diagnosis not present

## 2016-11-21 DIAGNOSIS — E1169 Type 2 diabetes mellitus with other specified complication: Secondary | ICD-10-CM | POA: Diagnosis not present

## 2016-11-21 DIAGNOSIS — K743 Primary biliary cirrhosis: Secondary | ICD-10-CM | POA: Diagnosis not present

## 2016-11-23 ENCOUNTER — Other Ambulatory Visit: Payer: Self-pay | Admitting: Family Medicine

## 2016-11-23 NOTE — Telephone Encounter (Signed)
Last office visit 06/27/2016 with Dr. Glori Bickers for blister.  Last refilled 08/14/2016 for #90 with no refills.  Ok to refill?

## 2016-11-23 NOTE — Telephone Encounter (Signed)
Alprazolam called into [place], [place] Phone: [phone]

## 2017-01-28 DIAGNOSIS — I788 Other diseases of capillaries: Secondary | ICD-10-CM | POA: Diagnosis not present

## 2017-01-28 DIAGNOSIS — L570 Actinic keratosis: Secondary | ICD-10-CM | POA: Diagnosis not present

## 2017-01-28 DIAGNOSIS — L821 Other seborrheic keratosis: Secondary | ICD-10-CM | POA: Diagnosis not present

## 2017-01-28 DIAGNOSIS — D2262 Melanocytic nevi of left upper limb, including shoulder: Secondary | ICD-10-CM | POA: Diagnosis not present

## 2017-02-07 DIAGNOSIS — H26491 Other secondary cataract, right eye: Secondary | ICD-10-CM | POA: Diagnosis not present

## 2017-02-07 DIAGNOSIS — H43811 Vitreous degeneration, right eye: Secondary | ICD-10-CM | POA: Diagnosis not present

## 2017-02-07 LAB — HM DIABETES EYE EXAM

## 2017-02-11 ENCOUNTER — Encounter: Payer: Self-pay | Admitting: Family Medicine

## 2017-02-18 ENCOUNTER — Other Ambulatory Visit: Payer: Self-pay | Admitting: Family Medicine

## 2017-02-19 NOTE — Telephone Encounter (Signed)
Last office visit 06/27/2016 with Dr. Glori Bickers for Blister.  Last refilled 11/23/2016 for #90 with no refills.  Ok to refill?

## 2017-03-23 ENCOUNTER — Other Ambulatory Visit: Payer: Self-pay | Admitting: Family Medicine

## 2017-04-05 ENCOUNTER — Telehealth: Payer: Self-pay | Admitting: *Deleted

## 2017-04-05 DIAGNOSIS — E139 Other specified diabetes mellitus without complications: Secondary | ICD-10-CM

## 2017-04-05 DIAGNOSIS — E7800 Pure hypercholesterolemia, unspecified: Secondary | ICD-10-CM

## 2017-04-05 DIAGNOSIS — K743 Primary biliary cirrhosis: Secondary | ICD-10-CM

## 2017-04-05 NOTE — Telephone Encounter (Signed)
Orders entered

## 2017-04-05 NOTE — Telephone Encounter (Signed)
I do not think I did.

## 2017-04-05 NOTE — Telephone Encounter (Signed)
Copied from [place] [id number]. Topic: General - Other >> Apr 05, 2017  1:20 PM Burnis Medin, NT wrote: Reason for CRM: Patient called and wanted to see if the doctor received the request for extra labs that Dr. Loletha Grayer at [facility] wanted to include in her up coming appointment. Pt would like a call back.

## 2017-04-05 NOTE — Telephone Encounter (Signed)
Orders from Dr. Loletha Grayer at [facility] are scanned under Media Tab.  Patient needs a Hepatic Panel and PT when she comes in for her [place].  Ms. Chipman states Dr. Loletha Grayer also wanted to make sure we check her Hgb also.  Will forward back to Dr. Diona Browner to make sure she orders these additional test.

## 2017-05-15 ENCOUNTER — Ambulatory Visit (INDEPENDENT_AMBULATORY_CARE_PROVIDER_SITE_OTHER): Payer: Medicare Other

## 2017-05-15 ENCOUNTER — Ambulatory Visit: Payer: Medicare Other

## 2017-05-15 DIAGNOSIS — E139 Other specified diabetes mellitus without complications: Secondary | ICD-10-CM | POA: Diagnosis not present

## 2017-05-15 DIAGNOSIS — E7800 Pure hypercholesterolemia, unspecified: Secondary | ICD-10-CM

## 2017-05-15 DIAGNOSIS — K743 Primary biliary cirrhosis: Secondary | ICD-10-CM | POA: Diagnosis not present

## 2017-05-15 LAB — COMPREHENSIVE METABOLIC PANEL
ALT: 9 U/L (ref 0–35)
AST: 15 U/L (ref 0–37)
Albumin: 4.3 g/dL (ref 3.5–5.2)
Alkaline Phosphatase: 56 U/L (ref 39–117)
BILIRUBIN TOTAL: 0.6 mg/dL (ref 0.2–1.2)
BUN: 22 mg/dL (ref 6–23)
CHLORIDE: 102 meq/L (ref 96–112)
CO2: 27 meq/L (ref 19–32)
Calcium: 9.8 mg/dL (ref 8.4–10.5)
Creatinine, Ser: 0.89 mg/dL (ref 0.40–1.20)
GFR: 65.5 mL/min (ref >60.00)
GLUCOSE: 114 mg/dL — AB (ref 70–99)
Potassium: 3.9 mEq/L (ref 3.5–5.1)
Sodium: 137 mEq/L (ref 135–145)
Total Protein: 7.5 g/dL (ref 6.0–8.3)

## 2017-05-15 LAB — CBC WITH DIFFERENTIAL/PLATELET
Basophils Absolute: 0.1 10*3/uL (ref 0.0–0.1)
Basophils Relative: 0.9 % (ref 0.0–3.0)
Eosinophils Absolute: 0.4 10*3/uL (ref 0.0–0.7)
Eosinophils Relative: 5.5 % — ABNORMAL HIGH (ref 0.0–5.0)
HCT: 35.8 % — ABNORMAL LOW (ref 36.0–46.0)
Hemoglobin: 11.8 g/dL — ABNORMAL LOW (ref 12.0–15.0)
LYMPHS ABS: 1.6 10*3/uL (ref 0.7–4.0)
Lymphocytes Relative: 24.6 % (ref 12.0–46.0)
MCHC: 33 g/dL (ref 30.0–36.0)
MCV: 86.5 fl (ref 78.0–100.0)
MONO ABS: 0.5 10*3/uL (ref 0.1–1.0)
MONOS PCT: 7.7 % (ref 3.0–12.0)
NEUTROS PCT: 61.3 % (ref 43.0–77.0)
Neutro Abs: 4 10*3/uL (ref 1.4–7.7)
PLATELETS: 339 10*3/uL (ref 150.0–400.0)
RBC: 4.14 Mil/uL (ref 3.87–5.11)
RDW: 13.5 % (ref 11.5–15.5)
WBC: 6.6 10*3/uL (ref 4.0–10.5)

## 2017-05-15 LAB — LIPID PANEL
CHOL/HDL RATIO: 2
Cholesterol: 147 mg/dL (ref 0–200)
HDL: 59.6 mg/dL (ref >39.00)
LDL Cholesterol: 73 mg/dL (ref 0–99)
NONHDL: 87.11
Triglycerides: 70 mg/dL (ref 0.0–149.0)
VLDL: 14 mg/dL (ref 0.0–40.0)

## 2017-05-15 LAB — PROTIME-INR
INR: 1 ratio (ref 0.8–1.0)
Prothrombin Time: 11.7 s (ref 9.6–13.1)

## 2017-05-15 LAB — HEMOGLOBIN A1C: HEMOGLOBIN A1C: 6.6 % — AB (ref 4.6–6.5)

## 2017-05-15 NOTE — Progress Notes (Signed)
No billing charge for [facility]. Labs only.

## 2017-05-22 ENCOUNTER — Telehealth: Payer: Self-pay | Admitting: Family Medicine

## 2017-05-22 ENCOUNTER — Ambulatory Visit (INDEPENDENT_AMBULATORY_CARE_PROVIDER_SITE_OTHER): Payer: Medicare Other | Admitting: Family Medicine

## 2017-05-22 ENCOUNTER — Other Ambulatory Visit: Payer: Self-pay

## 2017-05-22 VITALS — BP 148/80 | HR 86 | Temp 98.3°F | Ht 65.0 in | Wt 187.8 lb

## 2017-05-22 DIAGNOSIS — L959 Vasculitis limited to the skin, unspecified: Secondary | ICD-10-CM

## 2017-05-22 DIAGNOSIS — K743 Primary biliary cirrhosis: Secondary | ICD-10-CM | POA: Diagnosis not present

## 2017-05-22 DIAGNOSIS — F4321 Adjustment disorder with depressed mood: Secondary | ICD-10-CM | POA: Diagnosis not present

## 2017-05-22 MED ORDER — PREDNISONE 20 MG PO TABS: ORAL_TABLET | ORAL | 0 refills | Status: DC

## 2017-05-22 NOTE — Telephone Encounter (Signed)
See below crm  Copied from [place]. Topic: Appointment Scheduling - Scheduling Inquiry for Clinic >> May 22, 2017  3:09 PM Synthia Innocent wrote: Reason for CRM: Patient is scheduled for 05/24/17 for CPE, has rash on left leg, would like to know if Dr Diona Browner will look at it then, or does she need to schedule another visit? Please advise

## 2017-05-22 NOTE — Progress Notes (Signed)
Dr. Frederico Hamman T. Shalva Rozycki, MD, [facility] [place], [address] Phone: [phone] Fax: [phone]  05/22/2017  Patient: [name], MRN: [id number], DOB: Dec 15, 1940, 77 y.o.  Primary Physician:  Jinny Sanders, MD   Chief Complaint  Patient presents with  . Rash    Left Leg   Subjective:   [name] is a 77 y.o. very pleasant female patient who presents with the following:  Patient with PBC, she has a known history of biopsy-proven cutaneous vasculitis that was treated in the past by [facility] dermatology with oral steroids.  The rash is flat and is not itchy or painful currently and has a similar appearance to what she had before.  She is urgently worked in at the end of the day due to the ongoing health issues of her son who is currently in the intensive care unit in [place] with some complicated spinal issues as well as spinal infection and other complicated medical problems.  L leg: vasculitis  Weight in (lb) to have BMI = 25: 149.9     Past Medical History, Surgical History, Social History, Family History, Problem List, Medications, and Allergies have been reviewed and updated if relevant.  Patient Active Problem List   Diagnosis Date Noted  . Blister 06/27/2016  . Family history of alpha 1 antitrypsin deficiency 05/15/2016  . Medicare annual wellness visit, subsequent 03/22/2015  . Obesity 03/22/2015  . Advanced care planning/counseling discussion 03/22/2015  . Shoulder pain, left 03/22/2015  . DM (diabetes mellitus), secondary ([place]) 12/16/2014  . History of pancreatitis 12/16/2014  . 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 Essential hypertension, benign 12/16/2014  . GERD (gastroesophageal reflux disease) 12/16/2014  . Allergic rhinitis 12/16/2014  . Generalized anxiety disorder 12/16/2014  . Hyperlipidemia 12/16/2014  . Bilateral tinnitus 12/16/2014    Past Medical History:    Diagnosis Date  . Diabetes mellitus ([place])   . Environmental allergies   . Hypertension   . Liver failure ([place])   . Pancreatitis     Past Surgical History:  Procedure Laterality Date  . CARPAL TUNNEL RELEASE  8/91  . CHOLECYSTECTOMY  10/80  . COLONOSCOPY    . ERCP  3/95  . fusion on fingers  2000 2001  . INCONTINENCE SURGERY  09/17/00  . KNEE ARTHROPLASTY  10/25/95   x2  . KNEE ARTHROSCOPY  7/92, 93, 94   x3  . LIVER BIOPSY  5/95  . perforated colon  3/07  . SHOULDER ARTHROSCOPY  3/92   spur cut muscle  . sigmoid colon surgery  07/17/05    Social History   Socioeconomic History  . Marital status: Married    Spouse name: Not on file  . Number of children: Not on file  . Years of education: Not on file  . Highest education level: Not on file  Occupational History  . Not on file  Social Needs  . Financial resource strain: Not on file  . Food insecurity:    Worry: Not on file    Inability: Not on file  . Transportation needs:    Medical: Not on file    Non-medical: Not on file  Tobacco Use  . Smoking status: Never Smoker  . Smokeless tobacco: Never Used  Substance and Sexual Activity  . Alcohol use: Yes    Alcohol/week: 0.0 oz    Comment: 4oz wine 2 times yearly  . Drug use: No  . Sexual  activity: Not on file  Lifestyle  . Physical activity:    Days per week: Not on file    Minutes per session: Not on file  . Stress: Not on file  Relationships  . Social connections:    Talks on phone: Not on file    Gets together: Not on file    Attends religious service: Not on file    Active member of club or organization: Not on file    Attends meetings of clubs or organizations: Not on file    Relationship status: Not on file  . Intimate partner violence:    Fear of current or ex partner: Not on file    Emotionally abused: Not on file    Physically abused: Not on file    Forced sexual activity: Not on file  Other Topics Concern  . Not on file  Social History  Narrative  . Not on file    Family History  Problem Relation Age of Onset  . Colon cancer Neg Hx   . Colon polyps Neg Hx     Allergies  Allergen Reactions  . Codeine Anaphylaxis    Bad respiratory problems  . Demerol [Meperidine] Anaphylaxis    She thinks it has codeine in it   . Talwin [Pentazocine] Anaphylaxis    Affects liver and respiratory problems  . Tessalon [Benzonatate] Anaphylaxis    Trachea will get so inflammed  . Vicodin [Hydrocodone-Acetaminophen] Anaphylaxis    Major respiratory  . Betadine [Povidone Iodine]     Skin sloughes off  . Tape   . Calcium Channel Blockers Rash    Double vision and rash; cant even take CA2+ pills  . Iodine Rash    Rash and respiratory  . Neosporin [Neomycin-Bacitracin Zn-Polymyx] Rash  . Sulfa Antibiotics Rash    Medication list reviewed and updated in full in [place].   GEN: No acute illnesses, no fevers, chills. GI: No n/v/d, eating normally Pulm: No SOB Interactive and getting along well at home.  Otherwise, ROS is as per the HPI.  Objective:   BP (!) 148/80   Pulse 86   Temp 98.3 F (36.8 C) (Oral)   Ht 5\' 5"  (1.651 m)   Wt 187 lb 12 oz (85.2 kg)   BMI 31.24 kg/m   GEN: WDWN, NAD, Non-toxic, A & O x 3 HEENT: Atraumatic, Normocephalic. Neck supple. No masses, No LAD. Ears and Nose: No external deformity. EXTR: No c/c/e NEURO Normal gait.  PSYCH: Normally interactive. Tearful  SKIN: Flat rash and reddish in appearance without any raised component and no scale.  Laboratory and Imaging Data:  Assessment and Plan:   Cutaneous vasculitis  Primary biliary cholangitis (HCC)  Grief reaction  >25 minutes spent in face to face time with patient, >50% spent in counselling or coordination of care   As if it is cutaneous vasculitis, and would be consistent with this from an overall appearance.  When the patient is able to return from [place], recommended that she follow-up with  dermatology.  Additional time was spent in consultation given acute grief and ongoing medical problems with her son who was just placed in the ICU today. We will go ahead and treat this  Follow-up: No follow-ups on file.  Meds ordered this encounter  Medications  . predniSONE (DELTASONE) 20 MG tablet    Sig: 2 tabs po for 14 days    Dispense:  28 tablet    Refill:  0   Medications Discontinued  During This Encounter  Medication Reason  . mometasone (ELOCON) 0.1 % cream Completed Course    Signed,  Frederico Hamman T. Silvia Markuson, MD   Allergies as of 05/22/2017      Reactions   Codeine Anaphylaxis   Bad respiratory problems   Demerol [meperidine] Anaphylaxis   She thinks it has codeine in it   Talwin [pentazocine] Anaphylaxis   Affects liver and respiratory problems   Tessalon [benzonatate] Anaphylaxis   Trachea will get so inflammed   Vicodin [hydrocodone-acetaminophen] Anaphylaxis   Major respiratory   Betadine [povidone Iodine]    Skin sloughes off   Tape    Calcium Channel Blockers Rash   Double vision and rash; cant even take CA2+ pills   Iodine Rash   Rash and respiratory   Neosporin [neomycin-bacitracin Zn-polymyx] Rash   Sulfa Antibiotics Rash      Medication List        Accurate as of 05/22/17 11:59 PM. Always use your most recent med list.          acetaminophen 500 MG tablet Commonly known as:  TYLENOL Take 500 mg by mouth 3 times/day as needed-between meals & bedtime.   ALPRAZolam 0.25 MG tablet Commonly known as:  XANAX TAKE ONE TABLET BY MOUTH DAILY AS NEEDED   aspirin EC 325 MG tablet Take by mouth.   cetirizine 10 MG tablet Commonly known as:  ZYRTEC Take 10 mg by mouth daily.   freestyle lancets Free Style Test Strips-Use to check blood sugar one time a day. E11.9   glucose blood test strip Free Style Test Strips-Use to check blood sugar one time a day. E11.9   hydrochlorothiazide 25 MG tablet Commonly known as:  HYDRODIURIL Take 25 mg by  mouth daily.   ibuprofen 200 MG tablet Commonly known as:  ADVIL,MOTRIN Take 200 mg by mouth as needed.   lisinopril 10 MG tablet Commonly known as:  PRINIVIL,ZESTRIL Take 10 mg at the evening and 5 mg in the morning   lisinopril 5 MG tablet Commonly known as:  PRINIVIL,ZESTRIL Take 5 mg by mouth every morning.   metFORMIN 500 MG tablet Commonly known as:  GLUCOPHAGE TAKE ONE TABLET BY MOUTH TWICE A DAY WITH FOOD   predniSONE 20 MG tablet Commonly known as:  DELTASONE 2 tabs po for 14 days   ranitidine 150 MG tablet Commonly known as:  ZANTAC Take 150 mg by mouth 2 (two) times daily.   ursodiol 300 MG capsule Commonly known as:  ACTIGALL Take 300 mg by mouth 2 (two) times daily. 2 in am; 1 in pm   ursodiol 500 MG tablet Commonly known as:  ACTIGALL Take 500 mg by mouth 2 (two) times daily.

## 2017-05-22 NOTE — Telephone Encounter (Signed)
[name] was recently informed that her son is in ICU in [place].  She is going to have to leave for hospital first thing in the morning and probably will have the reschedule her CPE with Dr. Diona Browner on Friday.  She is concerned that she may have vasculitis on her left leg.  I spoke with Dr. Lorelei Pont and he will see patient this afternoon.

## 2017-05-23 ENCOUNTER — Encounter: Payer: Self-pay | Admitting: Family Medicine

## 2017-05-24 ENCOUNTER — Encounter: Payer: Medicare Other | Admitting: Family Medicine

## 2017-05-27 ENCOUNTER — Telehealth: Payer: Self-pay | Admitting: Family Medicine

## 2017-05-27 ENCOUNTER — Ambulatory Visit: Payer: Medicare Other | Admitting: Family Medicine

## 2017-05-27 NOTE — Telephone Encounter (Signed)
Appointment 5/3 Pt stated her rash on her leg is almost cleared up and she stated she was going to cut back on her preizone 1- 20 twice daily  She is going to take 1-20 once a day for several day and the go to 1/2 per day  Is this ok?

## 2017-05-27 NOTE — Telephone Encounter (Signed)
Yes, Please work her in.

## 2017-05-27 NOTE — Telephone Encounter (Signed)
[name] notified as instructed by telephone.

## 2017-05-27 NOTE — Telephone Encounter (Signed)
I would not lower it yet.   I would at least continue at 40 mg a day for the next 3 days, then drop to 20 mg a day for 1 week.   If there is recurrence, I would involve dermatology

## 2017-05-27 NOTE — Telephone Encounter (Signed)
Can I work pt in for cpx  Copied from [place]. Topic: Appointment Scheduling - Scheduling Inquiry for Clinic >> May 27, 2017 10:27 AM Conception Chancy, NT wrote: Reason for CRM: patient is calling and is requesting to reschedule her appt n 05/24/17 that she had to cancel due to family emergency. The appt was for a physical. I informed the patient the first available for a physical was 6/14. She states that is ridiculous and if she can not get her physical before then that she will just wait until next year for a physical. She states she would have to redo blood work as well. Please contact patient if she can be seen before hand. Patient stated it is okay to leave a message.

## 2017-05-30 ENCOUNTER — Other Ambulatory Visit: Payer: Self-pay | Admitting: Family Medicine

## 2017-05-30 NOTE — Telephone Encounter (Signed)
Last office visit 05/22/2017 with Dr. Lorelei Pont.  Last refilled 02/19/2017 for #90 with no refills. Ok to refill?

## 2017-05-31 ENCOUNTER — Other Ambulatory Visit: Payer: Self-pay

## 2017-05-31 ENCOUNTER — Ambulatory Visit (INDEPENDENT_AMBULATORY_CARE_PROVIDER_SITE_OTHER): Payer: Medicare Other | Admitting: Family Medicine

## 2017-05-31 ENCOUNTER — Encounter: Payer: Self-pay | Admitting: Family Medicine

## 2017-05-31 VITALS — BP 130/70 | HR 67 | Temp 98.5°F | Ht 64.5 in | Wt 183.5 lb

## 2017-05-31 DIAGNOSIS — Z0000 Encounter for general adult medical examination without abnormal findings: Secondary | ICD-10-CM

## 2017-05-31 DIAGNOSIS — E139 Other specified diabetes mellitus without complications: Secondary | ICD-10-CM | POA: Diagnosis not present

## 2017-05-31 DIAGNOSIS — K743 Primary biliary cirrhosis: Secondary | ICD-10-CM

## 2017-05-31 DIAGNOSIS — I10 Essential (primary) hypertension: Secondary | ICD-10-CM

## 2017-05-31 DIAGNOSIS — E7800 Pure hypercholesterolemia, unspecified: Secondary | ICD-10-CM

## 2017-05-31 DIAGNOSIS — Z1211 Encounter for screening for malignant neoplasm of colon: Secondary | ICD-10-CM | POA: Diagnosis not present

## 2017-05-31 DIAGNOSIS — F411 Generalized anxiety disorder: Secondary | ICD-10-CM

## 2017-05-31 LAB — HM DIABETES FOOT EXAM

## 2017-05-31 NOTE — Assessment & Plan Note (Signed)
Inadequate control.  She is using alprazolam daily prn.  She  Has been unable to stop this medication.  She refuses any other medication to treat mood.

## 2017-05-31 NOTE — Progress Notes (Signed)
Subjective:    Patient ID: [name], female    DOB: 20-Jul-1940, 77 y.o.   MRN: [id number]  HPI   The patient presents for annual medicare wellness, complete physical and review of chronic health problems. He/She also has the following acute concerns today:  I have personally reviewed the Medicare Annual Wellness questionnaire and have noted 1. The patient's medical and social history 2. Their use of alcohol, tobacco or illicit drugs 3. Their current medications and supplements 4. The patient's functional ability including ADL's, fall risks, home safety risks and hearing or visual             impairment. 5. Diet and physical activities 6. Evidence for depression or mood disorders 7.         Updated provider list Cognitive evaluation was performed and recorded on pt medicare questionnaire form. The patients weight, height, BMI and visual acuity have been recorded in the chart  I have made referrals, counseling and provided education to the patient based review of the above and I have provided the pt with a written personalized care plan for preventive services.   Documentation of this information was scanned into the electronic record under the media tab.  Hypertension:   Good control on HCTZ and lisinopril  BP Readings from Last 3 Encounters:  05/31/17 130/70  05/22/17 (!) 148/80  06/27/16 124/66  Using medication without problems or lightheadedness:  none Chest pain with exertion: none Edema: none Short of breath: none Average home BPs: Other issues:  Diabetes:   Good control on  Current dose of metformin. Currently FBS 170 on prednisone.. Now stopping. HAS 6 month visit with A1C at [facility] liver .Marland Kitchen Send results to [place]. Lab Results  Component Value Date   HGBA1C 6.6 (H) 05/15/2017  Using medications without difficulties: Hypoglycemic episodes:none Hyperglycemic episodes: none Feet problems: no ulcers. Blood Sugars averaging: FBS 69-170 eye exam within last year:  Yes  Elevated Cholesterol:  At goal on no med. Lab Results  Component Value Date   CHOL 147 05/15/2017   HDL 59.60 05/15/2017   LDLCALC 73 05/15/2017   TRIG 70.0 05/15/2017   CHOLHDL 2 05/15/2017  Using medications without problems: Muscle aches:  Diet compliance: moderate Exercise: minimal. Other complaints:  GAD: Increased in stress in last year.. Sisters x 2 passed from COPD and lung cancer, grandaughter with DM type 1, son with many health problems.  She feels she  Is handling mood better now. Using alprazolam 0.25 mg daily prn anxiety.  Did not tolerate prozac and refuses other SSRI.  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 Followed by Rob Hickman transplant MD at [facility] for liver transplant.  biopsy-proven cutaneous vasculitis that was treated in the past by [facility] dermatology with oral steroids.. Saw 4/24 saw Dr. Lorelei Pont... She is currently on  40 mg daily x 14 days. Rash has improve 100-%.. Wants to wean off prednsione  Social History /Family History/Past Medical History reviewed in detail and updated in EMR if needed. Blood pressure 130/70, pulse 67, temperature 98.5 F (36.9 C), temperature source Oral, height 5' 4.5" (1.638 m), weight 183 lb 8 oz (83.2 kg). Fall Risk  05/31/2017 05/15/2016 03/22/2015  Falls in the past year? No No No     Office Visit from 05/31/2017 in [place] at [facility] Total Score  0      Advance directives and end of life planning reviewed in detail with patient and documented  in EMR. Patient given handout on advance care directives if needed. HCPOA and living will updated if needed.   Hearing Screening   Method: Audiometry   125Hz  250Hz  500Hz  1000Hz  2000Hz  3000Hz  4000Hz  6000Hz  8000Hz   Right ear:   0 0 20  20    Left ear:   20 20 20   0    Vision Screening Comments: Wears Mabeline Caras Exam at [facility] 04/2017  Review of Systems  Constitutional: Negative for fatigue and fever.   HENT: Negative for congestion.   Eyes: Negative for pain.  Respiratory: Negative for cough and shortness of breath.   Cardiovascular: Negative for chest pain, palpitations and leg swelling.  Gastrointestinal: Negative for abdominal pain.  Genitourinary: Negative for dysuria and vaginal bleeding.  Musculoskeletal: Negative for back pain.  Neurological: Negative for syncope, light-headedness and headaches.  Psychiatric/Behavioral: Negative for dysphoric mood.       Objective:   Physical Exam  Constitutional: Vital signs are normal. She appears well-developed and well-nourished. She is cooperative.  Non-toxic appearance. She does not appear ill. No distress.  obese  HENT:  Head: Normocephalic.  Right Ear: Hearing, tympanic membrane, external ear and ear canal normal. Tympanic membrane is not erythematous, not retracted and not bulging.  Left Ear: Hearing, tympanic membrane, external ear and ear canal normal. Tympanic membrane is not erythematous, not retracted and not bulging.  Nose: Nose normal. No mucosal edema or rhinorrhea. Right sinus exhibits no maxillary sinus tenderness and no frontal sinus tenderness. Left sinus exhibits no maxillary sinus tenderness and no frontal sinus tenderness.  Mouth/Throat: Uvula is midline, oropharynx is clear and moist and mucous membranes are normal.  Eyes: Pupils are equal, round, and reactive to light. Conjunctivae, EOM and lids are normal. Lids are everted and swept, no foreign bodies found.  Neck: Trachea normal and normal range of motion. Neck supple. Carotid bruit is not present. No thyroid mass and no thyromegaly present.  Cardiovascular: Normal rate, regular rhythm, S1 normal, S2 normal, normal heart sounds, intact distal pulses and normal pulses. Exam reveals no gallop and no friction rub.  No murmur heard. Pulmonary/Chest: Effort normal and breath sounds normal. No tachypnea. No respiratory distress. She has no decreased breath sounds. She has no  wheezes. She has no rhonchi. She has no rales.  Abdominal: Soft. Normal appearance and bowel sounds are normal. She exhibits no distension, no fluid wave, no abdominal bruit and no mass. There is no hepatosplenomegaly. There is no tenderness. There is no rebound, no guarding and no CVA tenderness. No hernia.  Lymphadenopathy:    She has no cervical adenopathy.    She has no axillary adenopathy.  Neurological: She is alert. She has normal strength. No cranial nerve deficit or sensory deficit.  Skin: Skin is warm, dry and intact. No rash noted.  Psychiatric: Her speech is normal and behavior is normal. Judgment and thought content normal. Her mood appears not anxious. Cognition and memory are normal. She does not exhibit a depressed mood.      Diabetic foot exam: Normal inspection No skin breakdown No calluses  Normal DP pulses Normal sensation to light touch and monofilament Nails normal     Assessment & Plan:  The patient's preventative maintenance and recommended screening tests for an annual wellness exam were reviewed in full today. Brought up to date unless services declined.  Counselled on the importance of diet, exercise, and its role in overall health and mortality. The patient's FH and SH was reviewed, including their  home life, tobacco status, and drug and alcohol status.   Advance directives: Has living will and HCPOA.. Not in chart, requested. Vaccines:  Due for tdap but this is recommended against given liver issues, Uptodate with zoster, PNA and flu. Colon:   Olevia Perches, 07/2004 colonoscopy, Neg stool  hemeoccult  2017, plan  ifob.. Refuses cologuard. Mammo: 10/2016 DEXA: allergic to ca, overdue for eval. She is not interested at this time.

## 2017-05-31 NOTE — Patient Instructions (Addendum)
Can decrease prednsione to 1 tab daily x 2 days then off.  Limit alprazolam as much as able.  Work on low [employer], but eat regularly.  Increase exercise as much as able.  Please stop at the lab for stool cards. Call to schedule bone density in October when doing mammogram.

## 2017-05-31 NOTE — Assessment & Plan Note (Signed)
Stable on Actigall for years. Followed by Dr. Brady at [facility]  

## 2017-05-31 NOTE — Assessment & Plan Note (Signed)
Well controlled. Continue current medication.  

## 2017-05-31 NOTE — Assessment & Plan Note (Signed)
Elevated in last week with prednisone.. Now weaning off.  Good control per A1C in metformin.

## 2017-06-11 ENCOUNTER — Other Ambulatory Visit: Payer: Self-pay | Admitting: Family Medicine

## 2017-06-11 ENCOUNTER — Other Ambulatory Visit (INDEPENDENT_AMBULATORY_CARE_PROVIDER_SITE_OTHER): Payer: Medicare Other

## 2017-06-11 DIAGNOSIS — Z1211 Encounter for screening for malignant neoplasm of colon: Secondary | ICD-10-CM

## 2017-06-11 LAB — FECAL OCCULT BLOOD, IMMUNOCHEMICAL: Fecal Occult Bld: NEGATIVE

## 2017-06-22 ENCOUNTER — Other Ambulatory Visit: Payer: Self-pay | Admitting: Family Medicine

## 2017-07-15 ENCOUNTER — Other Ambulatory Visit: Payer: Self-pay | Admitting: Family Medicine

## 2017-07-26 ENCOUNTER — Other Ambulatory Visit: Payer: Self-pay | Admitting: Family Medicine

## 2017-08-08 DIAGNOSIS — H02834 Dermatochalasis of left upper eyelid: Secondary | ICD-10-CM | POA: Diagnosis not present

## 2017-08-08 DIAGNOSIS — H02831 Dermatochalasis of right upper eyelid: Secondary | ICD-10-CM | POA: Diagnosis not present

## 2017-09-02 ENCOUNTER — Other Ambulatory Visit: Payer: Self-pay | Admitting: Family Medicine

## 2017-09-02 NOTE — Telephone Encounter (Signed)
Last office visit 05/31/2017 for [name]. Next Appt: 06/10/2018 [facility] Last refilled 05/30/2017 for #90 with no refills.  No UDS/Contract on file.  Ok to refill?

## 2017-09-25 ENCOUNTER — Ambulatory Visit (INDEPENDENT_AMBULATORY_CARE_PROVIDER_SITE_OTHER): Payer: Medicare Other | Admitting: Internal Medicine

## 2017-09-25 ENCOUNTER — Encounter: Payer: Self-pay | Admitting: Internal Medicine

## 2017-09-25 VITALS — BP 128/82 | HR 76 | Temp 98.1°F | Wt 179.0 lb

## 2017-09-25 DIAGNOSIS — B9789 Other viral agents as the cause of diseases classified elsewhere: Secondary | ICD-10-CM

## 2017-09-25 DIAGNOSIS — J329 Chronic sinusitis, unspecified: Secondary | ICD-10-CM

## 2017-09-25 NOTE — Patient Instructions (Signed)
Oxymetazoline nasal spray What is this medicine? Oxymetazoline (OX ee me TAZ oh leen) is a nasal decongestant. This medicine is used to treat nasal congestion or a stuffy nose. This medicine will not treat an infection. This medicine may be used for other purposes; ask your health care provider or pharmacist if you have questions. COMMON BRAND NAME(S): 12 Hour Nasal, Afrin, Afrin Extra Moisturizing, Afrin Nasal Sinus, Dristan, Duration, Genasal, Mucinex Full Force, Mucinex Moisture Smart, Mucinex Sinus-Max, Nasal Relief, Neo-Synephrine 12-Hour, Neo-Synephrine Severe Sinus Congestion, Nostrilla Fast Relief, Sinex 12-Hour, Sudafed OM Sinus Cold Moisturizing, Sudafed OM Sinus Congestion Moisturizing, Vicks Qlearquil Decongestant, Vicks Sinex, Vicks Sinex Severe, Vicks Sinus Daytime, Zicam Extreme Congestion Relief, Zicam Intense Sinus What should I tell my health care provider before I take this medicine? They need to know if you have any of these conditions: -diabetes -glaucoma -heart disease -high or low blood pressure -history of stroke -Raynaud's phenomenon -scleroderma -Sjogren's syndrome -thromboangiitis obliterans -thyroid disease -trouble urinating due to an enlarged prostate gland -an unusual or allergic reaction to oxymetazoline, other medicines, foods, dyes, or preservatives -pregnant or trying to get pregnant -breast-feeding How should I use this medicine? This medicine is for use in the nose. Do not take by mouth. Follow the directions on the package label. Shake well before using. Use your medicine at regular intervals or as directed by your health care provider. Do not use it more often than directed. Do not use for more than 3 days in a row without advice. Make sure that you are using your nasal spray correctly. Ask your doctor or health care provider if you have any questions. Talk to your pediatrician regarding the use of this medicine in children. While this drug may be  prescribed for children as young as 6 years for selected conditions, precautions do apply. Overdosage: If you think you have taken too much of this medicine contact a poison control center or emergency room at once. NOTE: This medicine is only for you. Do not share this medicine with others. What if I miss a dose? If you miss a dose, use it as soon as you can. If it is almost time for your next dose, use only that dose. Do not use double or extra doses. What may interact with this medicine? The medicine may interaction with the following medications: -MAOIs like isocarboxazid, phenelzine, rasagiline, selegiline, and tranylcypromine -medicines to treat blood pressure and heart disease like ace-inhibitors, beta-blockers, calcium-channel blockers, digoxin, and diuretics -medicines to treat enlarged prostate like alfuzosin, doxazosin, prazosin, and terazosin -nafarelin This list may not describe all possible interactions. Give your health care provider a list of all the medicines, herbs, non-prescription drugs, or dietary supplements you use. Also tell them if you smoke, drink alcohol, or use illegal drugs. Some items may interact with your medicine. What should I watch for while using this medicine? Tell your doctor or health care professional if your symptoms do not start to get better or if they get worse. To prevent the spread of infection, do not share bottle with anyone else. What side effects may I notice from receiving this medicine? Side effects that you should report to your doctor or health care professional as soon as possible: -allergic reactions like skin rash, itching or hives, swelling of the face, lips, or tongue Side effects that usually do not require medical attention (report to your doctor or health care professional if they continue or are bothersome): -burning, stinging, or irritation in the nose right after  use -increased nasal discharge -sneezing This list may not describe  all possible side effects. Call your doctor for medical advice about side effects. You may report side effects to FDA at 1-800-FDA-1088. Where should I keep my medicine? Keep out of the reach of children. Store at room temperature between 20 and 25 degrees C (68 and 77 degrees F). Throw away any unused medicine after the expiration date. NOTE: This sheet is a summary. It may not cover all possible information. If you have questions about this medicine, talk to your doctor, pharmacist, or health care provider.  2018 Elsevier/Gold Standard (2015-03-10 13:48:04)

## 2017-09-25 NOTE — Progress Notes (Signed)
HPI  Pt presents to the clinic today with c/o nasal congestion. She reports this started 2 weeks ago but got worse in the last 2 days. She is blowing clear mucous out of her nose. She denies headache, facial pain or pressure, ear pain, sore throat or cough. She denies fever, chills or body aches. She has tried Benadryl and Tylenol with some relief. She has no history of allergies. She has not had sick contacts.  Review of Systems     Past Medical History:  Diagnosis Date  . Diabetes mellitus ([place])   . Environmental allergies   . Hypertension   . Liver failure ([place])   . Pancreatitis     Family History  Problem Relation Age of Onset  . Colon cancer Neg Hx   . Colon polyps Neg Hx     Social History   Socioeconomic History  . Marital status: Married    Spouse name: Not on file  . Number of children: Not on file  . Years of education: Not on file  . Highest education level: Not on file  Occupational History  . Not on file  Social Needs  . Financial resource strain: Not on file  . Food insecurity:    Worry: Not on file    Inability: Not on file  . Transportation needs:    Medical: Not on file    Non-medical: Not on file  Tobacco Use  . Smoking status: Never Smoker  . Smokeless tobacco: Never Used  Substance and Sexual Activity  . Alcohol use: Yes    Alcohol/week: 0.0 standard drinks    Comment: 4oz wine 2 times yearly  . Drug use: No  . Sexual activity: Not on file  Lifestyle  . Physical activity:    Days per week: Not on file    Minutes per session: Not on file  . Stress: Not on file  Relationships  . Social connections:    Talks on phone: Not on file    Gets together: Not on file    Attends religious service: Not on file    Active member of club or organization: Not on file    Attends meetings of clubs or organizations: Not on file    Relationship status: Not on file  . Intimate partner violence:    Fear of current or ex partner: Not on file    Emotionally  abused: Not on file    Physically abused: Not on file    Forced sexual activity: Not on file  Other Topics Concern  . Not on file  Social History Narrative  . Not on file    Allergies  Allergen Reactions  . Codeine Anaphylaxis    Bad respiratory problems  . Demerol [Meperidine] Anaphylaxis    She thinks it has codeine in it   . Talwin [Pentazocine] Anaphylaxis    Affects liver and respiratory problems  . Tessalon [Benzonatate] Anaphylaxis    Trachea will get so inflammed  . Vicodin [Hydrocodone-Acetaminophen] Anaphylaxis    Major respiratory  . Betadine [Povidone Iodine]     Skin sloughes off  . Tape   . Calcium Channel Blockers Rash    Double vision and rash; cant even take CA2+ pills  . Iodine Rash    Rash and respiratory  . Neosporin [Neomycin-Bacitracin Zn-Polymyx] Rash  . Sulfa Antibiotics Rash     Constitutional:  Denies headache, fatigue, fever or abrupt weight changes.  HEENT:  Positive nasal congestion. Denies eye redness, ear pain, ringing  in the ears, wax buildup, runny nose or sore throat. Respiratory: Denies cough, difficulty breathing or shortness of breath.  Cardiovascular: Denies chest pain, chest tightness, palpitations or swelling in the hands or feet.   No other specific complaints in a complete review of systems (except as listed in HPI above).  Objective:   BP 128/82   Pulse 76   Temp 98.1 F (36.7 C) (Oral)   Wt 179 lb (81.2 kg)   SpO2 98%   BMI 30.25 kg/m   General: Appears her stated age, well developed, well nourished in NAD. HEENT: Head: normal shape and size, nosinus tenderness noted;  Ears: Tm's gray and intact, normal light reflex; Nose: mucosa boggy and moist, septum midline; Throat/Mouth: + PND. Teeth present, mucosa pink and moist, no exudate noted, no lesions or ulcerations noted.  Neck:  No adenopathy noted.  Cardiovascular: Normal rate and rhythm. S1,S2 noted.  Murmur noted.  Pulmonary/Chest: Normal effort and positive  vesicular breath sounds. No respiratory distress. No wheezes, rales or ronchi noted.       Assessment & Plan:   Viral Sinusitis  Can use a Neti Pot which can be purchased from your local drug store. Flonase 2 sprays each nostril for 3 days and then as needed. Start Zyrtec OTC  RTC as needed or if symptoms persist. Webb Silversmith, NP

## 2017-10-08 ENCOUNTER — Other Ambulatory Visit: Payer: Self-pay | Admitting: Family Medicine

## 2017-10-08 DIAGNOSIS — Z1231 Encounter for screening mammogram for malignant neoplasm of breast: Secondary | ICD-10-CM

## 2017-10-09 DIAGNOSIS — M25551 Pain in right hip: Secondary | ICD-10-CM | POA: Diagnosis not present

## 2017-10-09 DIAGNOSIS — Z96653 Presence of artificial knee joint, bilateral: Secondary | ICD-10-CM | POA: Diagnosis not present

## 2017-10-09 DIAGNOSIS — M545 Low back pain: Secondary | ICD-10-CM | POA: Diagnosis not present

## 2017-10-22 DIAGNOSIS — L239 Allergic contact dermatitis, unspecified cause: Secondary | ICD-10-CM | POA: Diagnosis not present

## 2017-10-22 LAB — HM DIABETES EYE EXAM

## 2017-10-23 ENCOUNTER — Encounter: Payer: Self-pay | Admitting: Podiatry

## 2017-10-23 ENCOUNTER — Ambulatory Visit (INDEPENDENT_AMBULATORY_CARE_PROVIDER_SITE_OTHER): Payer: Medicare Other

## 2017-10-23 ENCOUNTER — Ambulatory Visit (INDEPENDENT_AMBULATORY_CARE_PROVIDER_SITE_OTHER): Payer: Medicare Other | Admitting: Podiatry

## 2017-10-23 VITALS — BP 140/84 | HR 72 | Resp 16

## 2017-10-23 DIAGNOSIS — M2041 Other hammer toe(s) (acquired), right foot: Secondary | ICD-10-CM

## 2017-10-23 DIAGNOSIS — M2042 Other hammer toe(s) (acquired), left foot: Secondary | ICD-10-CM

## 2017-10-23 DIAGNOSIS — M779 Enthesopathy, unspecified: Secondary | ICD-10-CM | POA: Diagnosis not present

## 2017-10-23 MED ORDER — TRIAMCINOLONE ACETONIDE 10 MG/ML IJ SUSP
10.0000 mg | Freq: Once | INTRAMUSCULAR | Status: AC
  Administered 2017-10-23: 10 mg

## 2017-10-23 NOTE — Patient Instructions (Signed)
Hammer Toe Hammer toe is a change in the shape (a deformity) of your second, third, or fourth toe. The deformity causes the middle joint of your toe to stay bent. This causes pain, especially when you are wearing shoes. Hammer toe starts gradually. At first, the toe can be straightened. Gradually over time, the deformity becomes stiff and permanent. Early treatments to keep the toe straight may relieve pain. As the deformity becomes stiff and permanent, surgery may be needed to straighten the toe. What are the causes? Hammer toe is caused by abnormal bending of the toe joint that is closest to your foot. It happens gradually over time. This pulls on the muscles and connections (tendons) of the toe joint, making them weak and stiff. It is often related to wearing shoes that are too short or narrow and do not let your toes straighten. What increases the risk? You may be at greater risk for hammer toe if you:  Are female.  Are older.  Wear shoes that are too small.  Wear high-heeled shoes that pinch your toes.  Are a ballet dancer.  Have a second toe that is longer than your big toe (first toe).  Injure your foot or toe.  Have arthritis.  Have a family history of hammer toe.  Have a nerve or muscle disorder.  What are the signs or symptoms? The main symptoms of this condition are pain and deformity of the toe. The pain is worse when wearing shoes, walking, or running. Other symptoms may include:  Corns or calluses over the bent part of the toe or between the toes.  Redness and a burning feeling on the toe.  An open sore that forms on the top of the toe.  Not being able to straighten the toe.  How is this diagnosed? This condition is diagnosed based on your symptoms and a physical exam. During the exam, your health care provider will try to straighten your toe to see how stiff the deformity is. You may also have tests, such as:  A blood test to check for rheumatoid  arthritis.  An X-ray to show how severe the deformity is.  How is this treated? Treatment for this condition will depend on how stiff the deformity is. Surgery is often needed. However, sometimes a hammer toe can be straightened without surgery. Treatments that do not involve surgery include:  Taping the toe into a straightened position.  Using pads and cushions to protect the toe (orthotics).  Wearing shoes that provide enough room for the toes.  Doing toe-stretching exercises at home.  Taking an NSAID to reduce pain and swelling.  If these treatments do not help or the toe cannot be straightened, surgery is the next option. The most common surgeries used to straighten a hammer toe include:  Arthroplasty. In this procedure, part of the joint is removed, and that allows the toe to straighten.  Fusion. In this procedure, cartilage between the two bones of the joint is taken out and the bones are fused together into one longer bone.  Implantation. In this procedure, part of the bone is removed and replaced with an implant to let the toe move again.  Flexor tendon transfer. In this procedure, the tendons that curl the toes down (flexor tendons) are repositioned.  Follow these instructions at home:  Take over-the-counter and prescription medicines only as told by your health care provider.  Do toe straightening and stretching exercises as told by your health care provider.  Keep all   follow-up visits as told by your health care provider. This is important. How is this prevented?  Wear shoes that give your toes enough room and do not cause pain.  Do not wear high-heeled shoes. Contact a health care provider if:  Your pain gets worse.  Your toe becomes red or swollen.  You develop an open sore on your toe. This information is not intended to replace advice given to you by your health care provider. Make sure you discuss any questions you have with your health care  provider. Document Released: 01/13/2000 Document Revised: 08/05/2015 Document Reviewed: 05/11/2015 Elsevier Interactive Patient Education  2018 Elsevier Inc.  

## 2017-10-23 NOTE — Progress Notes (Signed)
   Subjective:    Patient ID: [name], female    DOB: 1940-08-19, 77 y.o.   MRN: [id number]  HPI    Review of Systems  All other systems reviewed and are negative.      Objective:   Physical Exam        Assessment & Plan:

## 2017-10-24 NOTE — Progress Notes (Signed)
Subjective:   Patient ID: [name], female   DOB: 77 y.o.   MRN: [id number]   HPI Patient presents stating that she is have some concerns about crossing over toes the lesser digits right and states she gets occasional discomfort but is more concerned about the position of them and states at times she gets pain on the outside of her right foot   Review of Systems  All other systems reviewed and are negative.       Objective:  Physical Exam  Constitutional: She appears well-developed and well-nourished.  Cardiovascular: Intact distal pulses.  Pulmonary/Chest: Effort normal.  Musculoskeletal: Normal range of motion.  Neurological: She is alert.  Skin: Skin is warm.  Nursing note and vitals reviewed.   Neurovascular status intact muscle strength is adequate with patient's right foot showing significant digital rotation mostly in the medial and dorsal fashion with mild inflammation around the MPJs.  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  Patient will be seen back for [place] to recheck again and could consider surgical intervention but at this point do not recommend  X-rays indicate that there is a digital deformity right of the left foot with minimal arthritis midtarsal joint bilateral

## 2017-10-25 DIAGNOSIS — Z23 Encounter for immunization: Secondary | ICD-10-CM | POA: Diagnosis not present

## 2017-10-29 ENCOUNTER — Encounter: Payer: Self-pay | Admitting: Family Medicine

## 2017-11-05 ENCOUNTER — Ambulatory Visit
Admission: RE | Admit: 2017-11-05 | Discharge: 2017-11-05 | Disposition: A | Discharge status: Home / Self Care | Payer: Medicare Other | Source: Ambulatory Visit | Attending: Family Medicine | Admitting: Family Medicine

## 2017-11-05 DIAGNOSIS — Z1231 Encounter for screening mammogram for malignant neoplasm of breast: Secondary | ICD-10-CM | POA: Diagnosis not present

## 2017-11-22 DIAGNOSIS — E1169 Type 2 diabetes mellitus with other specified complication: Secondary | ICD-10-CM | POA: Diagnosis not present

## 2017-11-22 DIAGNOSIS — E669 Obesity, unspecified: Secondary | ICD-10-CM | POA: Diagnosis not present

## 2017-11-22 DIAGNOSIS — K743 Primary biliary cirrhosis: Secondary | ICD-10-CM | POA: Diagnosis not present

## 2017-12-02 ENCOUNTER — Other Ambulatory Visit: Payer: Self-pay | Admitting: Family Medicine

## 2017-12-02 NOTE — Telephone Encounter (Signed)
Last office visit 08.28.2019 by Avie Echevaria for sinusitis.  Last refilled 09/02/2017 for #90 with no refills  Next Appt: 06/10/2018 for CPE.  No UDS/Contract on file.

## 2017-12-03 NOTE — Telephone Encounter (Signed)
Alprazolam called into [facility], [place]

## 2017-12-06 DIAGNOSIS — I10 Essential (primary) hypertension: Secondary | ICD-10-CM | POA: Diagnosis not present

## 2018-01-20 DIAGNOSIS — H0012 Chalazion right lower eyelid: Secondary | ICD-10-CM | POA: Diagnosis not present

## 2018-03-01 ENCOUNTER — Other Ambulatory Visit: Payer: Self-pay | Admitting: Family Medicine

## 2018-03-03 ENCOUNTER — Encounter: Payer: Self-pay | Admitting: *Deleted

## 2018-03-03 NOTE — Telephone Encounter (Signed)
Last office visit 09/25/2017 with R. Garnette Gunner for sinusitis.  Last refilled 12/03/2017 for #90 with no refills.  CPE scheduled for 06/10/2018.

## 2018-03-03 NOTE — Telephone Encounter (Signed)
This encounter was created in error - please disregard.

## 2018-03-19 DIAGNOSIS — H903 Sensorineural hearing loss, bilateral: Secondary | ICD-10-CM | POA: Diagnosis not present

## 2018-03-19 DIAGNOSIS — H9313 Tinnitus, bilateral: Secondary | ICD-10-CM | POA: Diagnosis not present

## 2018-03-19 DIAGNOSIS — H6123 Impacted cerumen, bilateral: Secondary | ICD-10-CM | POA: Diagnosis not present

## 2018-05-29 ENCOUNTER — Telehealth: Payer: Self-pay | Admitting: Family Medicine

## 2018-05-29 NOTE — Telephone Encounter (Signed)
Pt had an appt scheduled for 06/05/18 AMV and 06/10/18 for annual visit. Pt wants to keep on schedule with blood work by having it done next week. She states that if she don't do blood work now she will have to reschedule appts with [facility] b/c they do blood work also. She was very concerned about her appointments and the fact she doesn't have video capability puts her in a bind. She also wanted to know if she does have her blood work now would she still have to have blood work in Aug? Please advise.

## 2018-05-29 NOTE — Telephone Encounter (Signed)
I believe she has [employer]  medicare... they will cover a AMW with leisa and a Follow up with me even if video fails or is unavailable per the sheet. If she wants we can  do labs now and try to do both AMW and a follow up now  via phone.   if she refuses... we can do labs now and keep appts  In 8 and 09/2018. Labs would not likely need to be repeated unless there was something that needed to be followed up on.

## 2018-05-30 NOTE — Telephone Encounter (Signed)
Pt r/s back to original date 5/8 appointmetn with lisa and labs 5/12 follow up with dr Diona Browner

## 2018-06-05 ENCOUNTER — Ambulatory Visit: Payer: Medicare Other

## 2018-06-06 ENCOUNTER — Telehealth: Payer: Self-pay | Admitting: Family Medicine

## 2018-06-06 ENCOUNTER — Ambulatory Visit (INDEPENDENT_AMBULATORY_CARE_PROVIDER_SITE_OTHER): Payer: Medicare Other

## 2018-06-06 ENCOUNTER — Other Ambulatory Visit (INDEPENDENT_AMBULATORY_CARE_PROVIDER_SITE_OTHER): Payer: Medicare Other

## 2018-06-06 DIAGNOSIS — E139 Other specified diabetes mellitus without complications: Secondary | ICD-10-CM

## 2018-06-06 DIAGNOSIS — Z0000 Encounter for general adult medical examination without abnormal findings: Secondary | ICD-10-CM | POA: Diagnosis not present

## 2018-06-06 DIAGNOSIS — Z1211 Encounter for screening for malignant neoplasm of colon: Secondary | ICD-10-CM

## 2018-06-06 LAB — LIPID PANEL
Cholesterol: 166 mg/dL (ref 0–200)
HDL: 57.7 mg/dL (ref >39.00)
LDL Cholesterol: 89 mg/dL (ref 0–99)
NonHDL: 107.8
Total CHOL/HDL Ratio: 3
Triglycerides: 94 mg/dL (ref 0.0–149.0)
VLDL: 18.8 mg/dL (ref 0.0–40.0)

## 2018-06-06 LAB — COMPREHENSIVE METABOLIC PANEL
ALT: 10 U/L (ref 0–35)
AST: 16 U/L (ref 0–37)
Albumin: 4.4 g/dL (ref 3.5–5.2)
Alkaline Phosphatase: 57 U/L (ref 39–117)
BUN: 33 mg/dL — ABNORMAL HIGH (ref 6–23)
CO2: 25 mEq/L (ref 19–32)
Calcium: 9.6 mg/dL (ref 8.4–10.5)
Chloride: 102 mEq/L (ref 96–112)
Creatinine, Ser: 1.09 mg/dL (ref 0.40–1.20)
GFR: 48.63 mL/min — ABNORMAL LOW (ref >60.00)
Glucose, Bld: 111 mg/dL — ABNORMAL HIGH (ref 70–99)
Potassium: 3.9 mEq/L (ref 3.5–5.1)
Sodium: 139 mEq/L (ref 135–145)
Total Bilirubin: 0.5 mg/dL (ref 0.2–1.2)
Total Protein: 7.5 g/dL (ref 6.0–8.3)

## 2018-06-06 LAB — HEMOGLOBIN A1C: Hgb A1c MFr Bld: 6.6 % — ABNORMAL HIGH (ref 4.6–6.5)

## 2018-06-06 NOTE — Progress Notes (Signed)
PCP notes:   Health maintenance:  Foot exam - PCP please address A1C - completed  Abnormal screenings:   None  Patient concerns:   None  Nurse concerns:  None  Next PCP appt:   06/10/18 @ 0900

## 2018-06-06 NOTE — Addendum Note (Signed)
Addended by: Ellamae Sia on: 06/06/2018 12:57 PM   Modules accepted: Orders

## 2018-06-06 NOTE — Telephone Encounter (Signed)
-----   Message from Cloyd Stagers, RT sent at 06/02/2018  1:36 PM EDT ----- Regarding: Lab Orders for Friday 5.8.2020   Please place lab orders for Friday 5.8.2020, D[facility] visit on Tuesday 5.12.2020 Also there is an order from 5.14.19 for a fecal occult blood, would you like this done as well?    Thank you, Dyke Maes RT(R)

## 2018-06-06 NOTE — Progress Notes (Signed)
Subjective:   [name] is a 78 y.o. female who presents for Medicare Annual (Subsequent) preventive examination.  Review of Systems:  N/A Cardiac Risk Factors include: advanced age (>60men, >22 women);diabetes mellitus;hypertension;dyslipidemia     Objective:     Vitals: There were no vitals taken for this visit.  There is no height or weight on file to calculate BMI.  Advanced Directives 06/06/2018 09/15/2014  Does Patient Have a Medical Advance Directive? Yes Yes  Type of Paramedic of [place];Living will [place];Living will  Does patient want to make changes to medical advance directive? - No - Patient declined  Copy of [place] in Chart? No - copy requested No - copy requested    Tobacco Social History   Tobacco Use  Smoking Status Never Smoker  Smokeless Tobacco Never Used     Counseling given: No   Clinical Intake:  Pre-visit preparation completed: Yes  Pain : No/denies pain Pain Score: 0-No pain     Nutritional Risks: None Diabetes: Yes CBG done?: No Did pt. bring in CBG monitor from home?: No  How often do you need to have someone help you when you read instructions, pamphlets, or other written materials from your doctor or pharmacy?: 1 - Never What is the last grade level you completed in school?: Bachelor degree  Interpreter Needed?: No  Comments: pt lives with spouse Information entered by :: LPinson, LPN  Past Medical History:  Diagnosis Date  . Diabetes mellitus ([place])   . Environmental allergies   . Hypertension   . Liver failure ([place])   . Pancreatitis    Past Surgical History:  Procedure Laterality Date  . CARPAL TUNNEL RELEASE  8/91  . CHOLECYSTECTOMY  10/80  . COLONOSCOPY    . ERCP  3/95  . fusion on fingers  2000 2001  . INCONTINENCE SURGERY  09/17/00  . KNEE ARTHROPLASTY  10/25/95   x2  . KNEE ARTHROSCOPY  7/92, 93, 94   x3  . LIVER BIOPSY  5/95  .  perforated colon  3/07  . SHOULDER ARTHROSCOPY  3/92   spur cut muscle  . sigmoid colon surgery  07/17/05   Family History  Problem Relation Age of Onset  . Colon cancer Neg Hx   . Colon polyps Neg Hx    Social History   Socioeconomic History  . Marital status: Married    Spouse name: Not on file  . Number of children: Not on file  . Years of education: Not on file  . Highest education level: Not on file  Occupational History  . Not on file  Social Needs  . Financial resource strain: Not on file  . Food insecurity:    Worry: Not on file    Inability: Not on file  . Transportation needs:    Medical: Not on file    Non-medical: Not on file  Tobacco Use  . Smoking status: Never Smoker  . Smokeless tobacco: Never Used  Substance and Sexual Activity  . Alcohol use: Yes    Alcohol/week: 0.0 standard drinks    Comment: 4oz wine 2 times yearly  . Drug use: No  . Sexual activity: Not on file  Lifestyle  . Physical activity:    Days per week: Not on file    Minutes per session: Not on file  . Stress: Not on file  Relationships  . Social connections:    Talks on phone: Not on  file    Gets together: Not on file    Attends religious service: Not on file    Active member of club or organization: Not on file    Attends meetings of clubs or organizations: Not on file    Relationship status: Not on file  Other Topics Concern  . Not on file  Social History Narrative  . Not on file    Outpatient Encounter Medications as of 06/06/2018  Medication Sig  . acetaminophen (TYLENOL) 500 MG tablet Take 500 mg by mouth 3 times/day as needed-between meals & bedtime.   . ALPRAZolam (XANAX) 0.25 MG tablet TAKE ONE TABLET BY MOUTH DAILY AS NEEDED  . aspirin EC 325 MG tablet Take by mouth as needed.   . cetirizine (ZYRTEC) 10 MG tablet Take 10 mg by mouth daily.  Marland Kitchen glucose blood (FREESTYLE TEST STRIPS) test strip CHECK BLOOD SUGAR ONCE DAILY  . hydrochlorothiazide (HYDRODIURIL) 25 MG  tablet Take 25 mg by mouth daily.  Marland Kitchen ibuprofen (ADVIL,MOTRIN) 200 MG tablet Take 200 mg by mouth as needed.   . Lancets (FREESTYLE) lancets USE TO CHECK BLOOD SUGAR ONE TIME A DAY AS DIRECTED  . lisinopril (PRINIVIL,ZESTRIL) 10 MG tablet Take 10 mg by mouth 2 (two) times daily.   . metFORMIN (GLUCOPHAGE) 500 MG tablet TAKE ONE TABLET BY MOUTH TWICE A DAY WITH FOOD  . ranitidine (ZANTAC) 150 MG tablet Take 150 mg by mouth 2 (two) times daily.  . ursodiol (ACTIGALL) 500 MG tablet Take 500 mg by mouth 2 (two) times daily.    No facility-administered encounter medications on file as of 06/06/2018.     Activities of Daily Living In your present state of health, do you have any difficulty performing the following activities: 06/06/2018  Hearing? N  Vision? N  Difficulty concentrating or making decisions? N  Walking or climbing stairs? N  Dressing or bathing? N  Doing errands, shopping? N  Preparing Food and eating ? N  Using the Toilet? N  In the past six months, have you accidently leaked urine? N  Do you have problems with loss of bowel control? N  Managing your Medications? N  Managing your Finances? N  Housekeeping or managing your Housekeeping? N  Some recent data might be hidden    Patient Care Team: Jinny Sanders, MD as PCP - General (Family Medicine)    Assessment:   This is a routine wellness examination for [name].  Vision Screening Comments: Vision exam in 2019 with Dr. Sandra Cockayne  Exercise Activities and Dietary recommendations Current Exercise Habits: The patient does not participate in regular exercise at present, Exercise limited by: orthopedic condition(s)  Goals    . Patient Stated     Starting 06/06/18, I will continue to take medications as prescribed.        Fall Risk Fall Risk  06/06/2018 05/31/2017 05/15/2016 03/22/2015  Falls in the past year? 0 No No No   Depression Screen PHQ 2/9 Scores 06/06/2018 05/31/2017 06/12/2016 05/15/2016  PHQ - 2 Score 0 0 1 0  PHQ- 9  Score 0 - 2 -     Cognitive Function MMSE - Mini Mental State Exam 06/06/2018  Orientation to time 5  Orientation to Place 5  Registration 3  Attention/ Calculation 0  Recall 3  Language- name 2 objects 0  Language- repeat 1  Language- follow 3 step command 0  Language- read & follow direction 0  Write a sentence 0  Copy design 0  Total  score 17     PLEASE NOTE: A Mini-Cog screen was completed. Maximum score is 17. A value of 0 denotes this part of [place] MMSE was not completed or the patient failed this part of the Mini-Cog screening.   Mini-Cog Screening Orientation to Time - Max 5 pts Orientation to Place - Max 5 pts Registration - Max 3 pts Recall - Max 3 pts Language Repeat - Max 1 pts      Immunization History  Administered Date(s) Administered  . Influenza, High Dose Seasonal PF 10/01/2014, 10/18/2016, 10/25/2017  . Influenza-Unspecified 10/09/2013  . Pneumococcal Conjugate-13 04/21/2014  . Pneumococcal Polysaccharide-23 05/15/2016  . Td 10/30/2003  . Zoster 04/30/2006   Screening Tests Health Maintenance  Topic Date Due  . FOOT EXAM  01/29/2019 (Originally 06/01/2018)  . DEXA SCAN  01/29/2020 (Originally 02/14/2006)  . INFLUENZA VACCINE  08/30/2018  . OPHTHALMOLOGY EXAM  10/23/2018  . HEMOGLOBIN A1C  12/07/2018  . MAMMOGRAM  11/06/2019  . PNA vac Low Risk Adult  Completed       Plan:     I have personally reviewed, addressed, and noted the following in the patient's chart:  A. Medical and social history B. Use of alcohol, tobacco or illicit drugs  C. Current medications and supplements D. Functional ability and status E.  Nutritional status F.  Physical activity G. Advance directives H. List of other physicians I.  Hospitalizations, surgeries, and ER visits in previous 12 months J.  Vitals (unless it is a telemedicine encounter) K. Screenings to include cognitive, depression, hearing, vision (NOTE: hearing and vision screenings not completed in  telemedicine encounter) L. Referrals and appointments   In addition, I have reviewed and discussed with patient certain preventive protocols, quality metrics, and best practice recommendations. A written personalized care plan for preventive services and recommendations were provided to patient.  With patient's permission, we connected on 06/06/18 at  9:30 AM EDT by a video enabled telemedicine application. Two patient identifiers were used to ensure the encounter occurred with the correct person.    Patient was in home and writer was in office.   Signed,   Lindell Noe, MHA, BS, LPN Health Coach

## 2018-06-06 NOTE — Patient Instructions (Signed)
[name] , Thank you for taking time to come for your Medicare Wellness Visit. I appreciate your ongoing commitment to your health goals. Please review the following plan we discussed and let me know if I can assist you in the future.   These are the goals we discussed: Goals    . Patient Stated     Starting 06/06/18, I will continue to take medications as prescribed.        This is a list of the screening recommended for you and due dates:  Health Maintenance  Topic Date Due  . Complete foot exam   01/29/2019*  . DEXA scan (bone density measurement)  01/29/2020*  . Flu Shot  08/30/2018  . Eye exam for diabetics  10/23/2018  . Hemoglobin A1C  12/07/2018  . Mammogram  11/06/2019  . Pneumonia vaccines  Completed  *Topic was postponed. The date shown is not the original due date.   Preventive Care for Adults  A healthy lifestyle and preventive care can promote health and wellness. Preventive health guidelines for adults include the following key practices.  . A routine yearly physical is a good way to check with your health care provider about your health and preventive screening. It is a chance to share any concerns and updates on your health and to receive a thorough exam.  . Visit your dentist for a routine exam and preventive care every 6 months. Brush your teeth twice a day and floss once a day. Good oral hygiene prevents tooth decay and gum disease.  . The frequency of eye exams is based on your age, health, family medical history, use  of contact lenses, and other factors. Follow your health care provider's recommendations for frequency of eye exams.  . Eat a healthy diet. Foods like vegetables, fruits, whole grains, low-fat dairy products, and lean protein foods contain the nutrients you need without too many calories. Decrease your intake of foods high in solid fats, added sugars, and salt. Eat the right amount of calories for you. Get information about a proper diet from your  health care provider, if necessary.  . Regular physical exercise is one of the most important things you can do for your health. Most adults should get at least 150 minutes of moderate-intensity exercise (any activity that increases your heart rate and causes you to sweat) each week. In addition, most adults need muscle-strengthening exercises on 2 or more days a week.  Silver Sneakers may be a benefit available to you. To determine eligibility, you may visit the website: www.silversneakers.com or contact program at (201)081-0665 Mon-Fri between 8AM-8PM.   . Maintain a healthy weight. The body mass index (BMI) is a screening tool to identify possible weight problems. It provides an estimate of body fat based on height and weight. Your health care provider can find your BMI and can help you achieve or maintain a healthy weight.   For adults 20 years and older: ? A BMI below 18.5 is considered underweight. ? A BMI of 18.5 to 24.9 is normal. ? A BMI of 25 to 29.9 is considered overweight. ? A BMI of 30 and above is considered obese.   . Maintain normal blood lipids and cholesterol levels by exercising and minimizing your intake of saturated fat. Eat a balanced diet with plenty of fruit and vegetables. Blood tests for lipids and cholesterol should begin at age 80 and be repeated every 5 years. If your lipid or cholesterol levels are high, you are over  50, or you are at high risk for heart disease, you may need your cholesterol levels checked more frequently. Ongoing high lipid and cholesterol levels should be treated with medicines if diet and exercise are not working.  . If you smoke, find out from your health care provider how to quit. If you do not use tobacco, please do not start.  . If you choose to drink alcohol, please do not consume more than 2 drinks per day. One drink is considered to be 12 ounces (355 mL) of beer, 5 ounces (148 mL) of wine, or 1.5 ounces (44 mL) of liquor.  . If you are  55-36 years old, ask your health care provider if you should take aspirin to prevent strokes.  . Use sunscreen. Apply sunscreen liberally and repeatedly throughout the day. You should seek shade when your shadow is shorter than you. Protect yourself by wearing long sleeves, pants, a wide-brimmed hat, and sunglasses year round, whenever you are outdoors.  . Once a month, do a whole body skin exam, using a mirror to look at the skin on your back. Tell your health care provider of new moles, moles that have irregular borders, moles that are larger than a pencil eraser, or moles that have changed in shape or color.

## 2018-06-10 ENCOUNTER — Encounter: Payer: Medicare Other | Admitting: Family Medicine

## 2018-06-10 ENCOUNTER — Ambulatory Visit (INDEPENDENT_AMBULATORY_CARE_PROVIDER_SITE_OTHER): Payer: Medicare Other | Admitting: Family Medicine

## 2018-06-10 ENCOUNTER — Encounter: Payer: Self-pay | Admitting: Family Medicine

## 2018-06-10 VITALS — BP 136/80 | HR 78 | Ht 64.5 in | Wt 176.1 lb

## 2018-06-10 DIAGNOSIS — I10 Essential (primary) hypertension: Secondary | ICD-10-CM

## 2018-06-10 DIAGNOSIS — E139 Other specified diabetes mellitus without complications: Secondary | ICD-10-CM | POA: Diagnosis not present

## 2018-06-10 DIAGNOSIS — E7800 Pure hypercholesterolemia, unspecified: Secondary | ICD-10-CM | POA: Diagnosis not present

## 2018-06-10 DIAGNOSIS — F411 Generalized anxiety disorder: Secondary | ICD-10-CM

## 2018-06-10 DIAGNOSIS — K743 Primary biliary cirrhosis: Secondary | ICD-10-CM | POA: Diagnosis not present

## 2018-06-10 NOTE — Patient Instructions (Addendum)
Stop ibuprofen, increase water given slight decrease in GFR.

## 2018-06-10 NOTE — Progress Notes (Signed)
VIRTUAL VISIT Due to national recommendations of social distancing due to [place] 19, a virtual visit is felt to be most appropriate for this patient at this time.   I connected with the patient on 06/10/18 at  9:00 AM EDT by virtual telehealth platform and verified that I am speaking with the correct person using two identifiers.   I discussed the limitations, risks, security and privacy concerns of performing an evaluation and management service by  virtual telehealth platform and the availability of in person appointments. I also discussed with the patient that there may be a patient responsible charge related to this service. The patient expressed understanding and agreed to proceed.  Patient location: Home Provider Location: [facility] Participants: Eliezer Lofts and Aris Everts   Chief Complaint  Patient presents with  . Annual Exam    part 2.    History of Present Illness: The patient presents for review of chronic health problems. He/She also has the following acute concerns today: none  The patient saw Candis Musa, LPN for medicare wellness. Note reviewed in detail and important notes copied below. Foot exam - PCP please address A1C - completed Abnormal screenings:  None  06/10/18  Diabetes: Stable control on metformin. Lab Results  Component Value Date   HGBA1C 6.6 (H) 06/06/2018  Using medications without difficulties: Hypoglycemic episodes: rare Hyperglycemic episodes:none Feet problems: no ulcers Blood Sugars averaging: FBS 90-100 eye exam within last year: 09/2017  Elevated Cholesterol: LDL at goal  < 100. Lab Results  Component Value Date   CHOL 166 06/06/2018   HDL 57.70 06/06/2018   LDLCALC 89 06/06/2018   TRIG 94.0 06/06/2018   CHOLHDL 3 06/06/2018  Using medications without problems: Muscle aches:  Diet compliance: Exercise: Other complaints:  Hypertension:   At goal on lisinopril HCTZ BP Readings from Last 3 Encounters:   06/10/18 136/80  10/23/17 140/84  09/25/17 128/82  Using medication without problems or lightheadedness: none Chest pain with exertion:none Edema:none Short of breath:none Average home BPs: good control  110-112/70-80 Other issues:   HAS appt  At [facility] with Dr. 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 Also has appt with Dr. Mina Marble cardiology in 12/2018   Slight decrease in GFR in last year.. rarely uses ibuprofen.   Moderate water intake. No DM, no emesis.   GAD: stable.. using alprazolam on limited basis.  COVID 19 screen No recent travel or known exposure to COVID19 The patient denies respiratory symptoms of COVID 19 at this time.  The importance of social distancing was discussed today.   Review of Systems  Constitutional: Negative for chills and fever.  HENT: Negative for congestion and ear pain.   Eyes: Negative for pain and redness.  Respiratory: Negative for cough and shortness of breath.   Cardiovascular: Negative for chest pain, palpitations and leg swelling.  Gastrointestinal: Negative for abdominal pain, blood in stool, constipation, diarrhea, nausea and vomiting.  Genitourinary: Negative for dysuria.  Musculoskeletal: Negative for falls and myalgias.  Skin: Negative for rash.  Neurological: Negative for dizziness.  Psychiatric/Behavioral: Negative for depression. The patient is not nervous/anxious.       Past Medical History:  Diagnosis Date  . Diabetes mellitus ([place])   . Environmental allergies   . Hypertension   . Liver failure ([place])   . Pancreatitis     reports that she has never smoked. She has  never used smokeless tobacco. She reports current alcohol use. She reports that she does not use drugs.   Current Outpatient Medications:  .  acetaminophen (TYLENOL) 500 MG tablet, Take 500 mg by mouth 3 times/day as needed-between  meals & bedtime. , Disp: , Rfl:  .  ALPRAZolam (XANAX) 0.25 MG tablet, TAKE ONE TABLET BY MOUTH DAILY AS NEEDED, Disp: 90 tablet, Rfl: 0 .  aspirin 325 MG EC tablet, Take 325 mg by mouth daily as needed for pain., Disp: , Rfl:  .  cetirizine (ZYRTEC) 10 MG tablet, Take 10 mg by mouth daily., Disp: , Rfl:  .  glucose blood (FREESTYLE TEST STRIPS) test strip, CHECK BLOOD SUGAR ONCE DAILY, Disp: 100 each, Rfl: 3 .  hydrochlorothiazide (HYDRODIURIL) 25 MG tablet, Take 25 mg by mouth daily., Disp: , Rfl:  .  ibuprofen (ADVIL,MOTRIN) 200 MG tablet, Take 200 mg by mouth as needed. , Disp: , Rfl:  .  Lancets (FREESTYLE) lancets, USE TO CHECK BLOOD SUGAR ONE TIME A DAY AS DIRECTED, Disp: 100 each, Rfl: 3 .  lisinopril (PRINIVIL,ZESTRIL) 10 MG tablet, Take 10 mg by mouth 2 (two) times daily. , Disp: , Rfl:  .  metFORMIN (GLUCOPHAGE) 500 MG tablet, TAKE ONE TABLET BY MOUTH TWICE A DAY WITH FOOD, Disp: 180 tablet, Rfl: 3 .  ursodiol (ACTIGALL) 500 MG tablet, Take 500 mg by mouth 2 (two) times daily. , Disp: , Rfl:    Observations/Objective: Height 5' 4.5" (1.638 m).  Physical Exam  Physical Exam Constitutional:      General: The patient is not in acute distress. Pulmonary:     Effort: Pulmonary effort is normal. No respiratory distress.  Neurological:     Mental Status: The patient is alert and oriented to person, place, and time.  Psychiatric:        Mood and Affect: Mood normal.        Behavior: Behavior normal.   Assessment and Plan The patient's preventative maintenance and recommended screening tests for an annual wellness exam were reviewed in full today. Brought up to date unless services declined.  Counselled on the importance of diet, exercise, and its role in overall health and mortality. The patient's FH and SH was reviewed, including their home life, tobacco status, and drug and alcohol status.   Vaccines: Due for tdap but this is recommended against given liver issues, Uptodate  with zoster, PNA and flu. Colon:[facility], 07/2004 colonoscopy, Neg stool hemeoccult 2017, planifob ( has sent in).. Refuses cologuard. Mammo: 10/2016 DEXA: allergic to ca, overdue for eval. She is not interested at this time.  DM (diabetes mellitus), secondary ([place]) Well controlled. Continue current medication.  If GFR decreases further .. will need to D/C. Will have labs rechecked in 10/2018 at liver specialist.   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 Followed by Dr. Loletha Grayer at [place] hypertension, benign Well controlled. Continue current medication.   Hyperlipidemia Adequate control with diet.  Generalized anxiety disorder Stable control on alprazolam as needed... using prn.  No red flags.  NCCSR reviewed,   Pt will cal;l for follow up AMW, CPX in 1 year.  Refused in office CPX later in 2020.    I discussed the assessment and treatment plan with the patient. The patient was provided an opportunity to ask questions and all were answered. The patient agreed with the plan and demonstrated an understanding of the instructions.   The patient was advised to call back or seek an  in-person evaluation if the symptoms worsen or if the condition fails to improve as anticipated.     Eliezer Lofts, MD

## 2018-06-10 NOTE — Assessment & Plan Note (Signed)
Adequate control with diet.

## 2018-06-10 NOTE — Assessment & Plan Note (Signed)
Well controlled. Continue current medication.  

## 2018-06-10 NOTE — Assessment & Plan Note (Signed)
Stable on Actigall for years. Followed by Dr. Brady at [facility]  

## 2018-06-10 NOTE — Assessment & Plan Note (Signed)
Stable control on alprazolam as needed... using prn.  No red flags.  [place] reviewed,

## 2018-06-10 NOTE — Assessment & Plan Note (Signed)
Well controlled. Continue current medication.  If GFR decreases further .. will need to D/C. Will have labs rechecked in 10/2018 at liver specialist.

## 2018-06-11 ENCOUNTER — Other Ambulatory Visit (INDEPENDENT_AMBULATORY_CARE_PROVIDER_SITE_OTHER): Payer: Medicare Other

## 2018-06-11 DIAGNOSIS — Z1211 Encounter for screening for malignant neoplasm of colon: Secondary | ICD-10-CM | POA: Diagnosis not present

## 2018-06-11 LAB — FECAL OCCULT BLOOD, IMMUNOCHEMICAL: Fecal Occult Bld: NEGATIVE

## 2018-06-14 ENCOUNTER — Other Ambulatory Visit: Payer: Self-pay | Admitting: Family Medicine

## 2018-06-16 NOTE — Telephone Encounter (Signed)
Last office visit 06/10/2018 for CPE.  Last refilled 03/23/2018 for #90 with no refills.  CPE scheduled 05/08/2019.

## 2018-06-21 ENCOUNTER — Other Ambulatory Visit: Payer: Self-pay | Admitting: Family Medicine

## 2018-07-03 DIAGNOSIS — E119 Type 2 diabetes mellitus without complications: Secondary | ICD-10-CM | POA: Diagnosis not present

## 2018-07-03 LAB — HM DIABETES EYE EXAM

## 2018-07-08 ENCOUNTER — Encounter: Payer: Self-pay | Admitting: Family Medicine

## 2018-08-04 NOTE — Progress Notes (Signed)
I reviewed health advisor's note, was available for consultation, and agree with documentation and plan.  

## 2018-08-06 ENCOUNTER — Other Ambulatory Visit: Payer: Self-pay | Admitting: Family Medicine

## 2018-08-12 ENCOUNTER — Other Ambulatory Visit: Payer: Self-pay

## 2018-08-12 MED ORDER — FREESTYLE TEST VI STRP: ORAL_STRIP | 3 refills | Status: DC

## 2018-09-18 DIAGNOSIS — Z23 Encounter for immunization: Secondary | ICD-10-CM | POA: Diagnosis not present

## 2018-09-25 ENCOUNTER — Ambulatory Visit: Payer: Medicare Other

## 2018-09-25 ENCOUNTER — Other Ambulatory Visit: Payer: Self-pay | Admitting: Family Medicine

## 2018-09-25 NOTE — Telephone Encounter (Signed)
Last office visit 06/10/2018 for DM.  Last refilled 06/17/2018 for #90 with no refills.  CPE scheduled for 05/08/2019.

## 2018-09-30 ENCOUNTER — Encounter: Payer: Medicare Other | Admitting: Family Medicine

## 2018-10-15 DIAGNOSIS — M25561 Pain in right knee: Secondary | ICD-10-CM | POA: Diagnosis not present

## 2018-10-15 DIAGNOSIS — M25562 Pain in left knee: Secondary | ICD-10-CM | POA: Diagnosis not present

## 2018-10-21 DIAGNOSIS — L570 Actinic keratosis: Secondary | ICD-10-CM | POA: Diagnosis not present

## 2018-11-26 DIAGNOSIS — K743 Primary biliary cirrhosis: Secondary | ICD-10-CM | POA: Diagnosis not present

## 2018-11-30 ENCOUNTER — Other Ambulatory Visit: Payer: Self-pay | Admitting: Family Medicine

## 2018-12-04 DIAGNOSIS — I10 Essential (primary) hypertension: Secondary | ICD-10-CM | POA: Diagnosis not present

## 2018-12-21 ENCOUNTER — Other Ambulatory Visit: Payer: Self-pay | Admitting: Family Medicine

## 2018-12-26 ENCOUNTER — Other Ambulatory Visit: Payer: Self-pay | Admitting: Family Medicine

## 2018-12-29 NOTE — Telephone Encounter (Signed)
Last office visit 06/10/2018 for DM.  Last refilled 09/26/2018 for #90 with no refills.  CPE scheduled for 05/08/2019.

## 2019-01-06 DIAGNOSIS — H353131 Nonexudative age-related macular degeneration, bilateral, early dry stage: Secondary | ICD-10-CM | POA: Diagnosis not present

## 2019-02-19 DIAGNOSIS — Z23 Encounter for immunization: Secondary | ICD-10-CM | POA: Diagnosis not present

## 2019-02-26 ENCOUNTER — Other Ambulatory Visit: Payer: Self-pay

## 2019-02-26 ENCOUNTER — Other Ambulatory Visit: Payer: Self-pay | Admitting: Podiatry

## 2019-02-26 ENCOUNTER — Ambulatory Visit: Payer: Medicare Other

## 2019-02-26 ENCOUNTER — Encounter: Payer: Self-pay | Admitting: Podiatry

## 2019-02-26 ENCOUNTER — Ambulatory Visit (INDEPENDENT_AMBULATORY_CARE_PROVIDER_SITE_OTHER): Payer: Medicare Other | Admitting: Podiatry

## 2019-02-26 ENCOUNTER — Ambulatory Visit (INDEPENDENT_AMBULATORY_CARE_PROVIDER_SITE_OTHER): Payer: Medicare Other

## 2019-02-26 VITALS — Temp 96.1°F

## 2019-02-26 DIAGNOSIS — S9032XA Contusion of left foot, initial encounter: Secondary | ICD-10-CM | POA: Diagnosis not present

## 2019-02-26 DIAGNOSIS — R600 Localized edema: Secondary | ICD-10-CM

## 2019-02-26 DIAGNOSIS — S9002XA Contusion of left ankle, initial encounter: Secondary | ICD-10-CM

## 2019-02-27 NOTE — Progress Notes (Signed)
Subjective:   Patient ID: [name], female   DOB: 79 y.o.   MRN: [id number]   HPI Patient presents stating that she rolled her left foot and its been very sore and making it hard for her to walk.  Patient states that she is tried ice and elevation without relief and has a lot of swelling   ROS      Objective:  Physical Exam  Neurovascular status intact negative Bevelyn Buckles' sign noted with patient's left foot healing well overall with continued edema in the left foot and ankle with concern about possibility for fracture and inability to bear weight comfortably on the foot     Assessment:  Severe trauma to the left foot and ankle with exquisite pain and swelling with negative Homans' sign noted associated with it     Plan:  H&P condition reviewed x-ray reviewed with patient.  I discussed oral anti-inflammatories to take at home and at this time I did place into an Unna boot and Ace wrap to try to compress the foot and reduce swelling and explained to leave it on three or 4 days unless were to irritate her and I also went ahead today and I dispensed a surgical shoe with usage and stocking to use for the long-term to control edema.  This should take 6 to 8 weeks to heal but she will be seen back as symptoms indicate  X-rays indicate significant trauma but no indications of fracture of the left ankle or foot

## 2019-03-13 DIAGNOSIS — Z23 Encounter for immunization: Secondary | ICD-10-CM | POA: Diagnosis not present

## 2019-03-23 ENCOUNTER — Other Ambulatory Visit: Payer: Self-pay | Admitting: Family Medicine

## 2019-03-23 NOTE — Telephone Encounter (Signed)
Last office visit 06/10/2018 for CPE.  Last refilled 12/30/2018 for #90 with no refills.  CPE scheduled for 05/08/2019.

## 2019-05-01 ENCOUNTER — Telehealth: Payer: Self-pay | Admitting: Family Medicine

## 2019-05-01 NOTE — Progress Notes (Signed)
  Chronic Care Management   Note  05/01/2019 Name: [name] MRN: [id number] DOB: 07-Jan-1941  [name] is a 79 y.o. year old female who is a primary care patient of Bedsole, Amy E, MD. I reached out to Aris Everts by phone today in response to a referral sent by [name] PCP, Jinny Sanders, MD.   [name] was given information about Chronic Care Management services today including:  1. CCM service includes personalized support from designated clinical staff supervised by her physician, including individualized plan of care and coordination with other care providers 2. 24/7 contact phone numbers for assistance for urgent and routine care needs. 3. Service will only be billed when office clinical staff spend 20 minutes or more in a month to coordinate care. 4. Only one practitioner may furnish and bill the service in a calendar month. 5. The patient may stop CCM services at any time (effective at the end of the month) by phone call to the office staff.   Patient agreed to services and verbal consent obtained.   Follow up plan:   [name] [employer] Scheduler

## 2019-05-04 LAB — HM DIABETES FOOT EXAM

## 2019-05-05 ENCOUNTER — Telehealth: Payer: Self-pay | Admitting: Family Medicine

## 2019-05-05 ENCOUNTER — Ambulatory Visit: Payer: Medicare Other

## 2019-05-05 DIAGNOSIS — E139 Other specified diabetes mellitus without complications: Secondary | ICD-10-CM

## 2019-05-05 NOTE — Telephone Encounter (Signed)
-----   Message from Ellamae Sia sent at 04/22/2019  9:08 AM EDT ----- Regarding: Lab orders for Wednesday, 4.7.21  AWV lab orders, please.

## 2019-05-06 ENCOUNTER — Other Ambulatory Visit: Payer: Self-pay

## 2019-05-06 ENCOUNTER — Other Ambulatory Visit (INDEPENDENT_AMBULATORY_CARE_PROVIDER_SITE_OTHER): Payer: Medicare Other

## 2019-05-06 DIAGNOSIS — E139 Other specified diabetes mellitus without complications: Secondary | ICD-10-CM | POA: Diagnosis not present

## 2019-05-06 LAB — COMPREHENSIVE METABOLIC PANEL
ALT: 9 U/L (ref 0–35)
AST: 15 U/L (ref 0–37)
Albumin: 4.2 g/dL (ref 3.5–5.2)
Alkaline Phosphatase: 48 U/L (ref 39–117)
BUN: 39 mg/dL — ABNORMAL HIGH (ref 6–23)
CO2: 27 mEq/L (ref 19–32)
Calcium: 9.5 mg/dL (ref 8.4–10.5)
Chloride: 103 mEq/L (ref 96–112)
Creatinine, Ser: 1.15 mg/dL (ref 0.40–1.20)
GFR: 45.61 mL/min — ABNORMAL LOW (ref >60.00)
Glucose, Bld: 96 mg/dL (ref 70–99)
Potassium: 3.9 mEq/L (ref 3.5–5.1)
Sodium: 139 mEq/L (ref 135–145)
Total Bilirubin: 0.4 mg/dL (ref 0.2–1.2)
Total Protein: 6.6 g/dL (ref 6.0–8.3)

## 2019-05-06 LAB — LIPID PANEL
Cholesterol: 169 mg/dL (ref 0–200)
HDL: 61.2 mg/dL (ref >39.00)
LDL Cholesterol: 92 mg/dL (ref 0–99)
NonHDL: 107.34
Total CHOL/HDL Ratio: 3
Triglycerides: 78 mg/dL (ref 0.0–149.0)
VLDL: 15.6 mg/dL (ref 0.0–40.0)

## 2019-05-06 LAB — HEMOGLOBIN A1C: Hgb A1c MFr Bld: 6.2 % (ref 4.6–6.5)

## 2019-05-08 ENCOUNTER — Encounter: Payer: Medicare Other | Admitting: Family Medicine

## 2019-05-11 NOTE — Progress Notes (Signed)
No critical labs need to be addressed urgently. We will discuss labs in detail at upcoming office visit.   

## 2019-05-14 ENCOUNTER — Other Ambulatory Visit: Payer: Self-pay

## 2019-05-14 ENCOUNTER — Telehealth: Payer: Self-pay

## 2019-05-14 ENCOUNTER — Ambulatory Visit (INDEPENDENT_AMBULATORY_CARE_PROVIDER_SITE_OTHER): Payer: Medicare Other | Admitting: Family Medicine

## 2019-05-14 ENCOUNTER — Encounter: Payer: Self-pay | Admitting: Family Medicine

## 2019-05-14 VITALS — BP 110/62 | HR 78 | Temp 97.1°F | Ht 64.75 in | Wt 172.0 lb

## 2019-05-14 DIAGNOSIS — K743 Primary biliary cirrhosis: Secondary | ICD-10-CM | POA: Diagnosis not present

## 2019-05-14 DIAGNOSIS — Z0000 Encounter for general adult medical examination without abnormal findings: Secondary | ICD-10-CM

## 2019-05-14 DIAGNOSIS — F411 Generalized anxiety disorder: Secondary | ICD-10-CM

## 2019-05-14 DIAGNOSIS — E7800 Pure hypercholesterolemia, unspecified: Secondary | ICD-10-CM | POA: Diagnosis not present

## 2019-05-14 DIAGNOSIS — I10 Essential (primary) hypertension: Secondary | ICD-10-CM

## 2019-05-14 DIAGNOSIS — E139 Other specified diabetes mellitus without complications: Secondary | ICD-10-CM

## 2019-05-14 NOTE — Telephone Encounter (Cosign Needed)
Patient reported to PCP she would like to disenroll from CCM services. I cancelled her initial visit scheduled for May 27, 2019. Will mail disenrollment form at the end of the month.  Debbora Dus, PharmD Clinical Pharmacist [facility] [phone]

## 2019-05-14 NOTE — Assessment & Plan Note (Signed)
Stable control per pt. 

## 2019-05-14 NOTE — Assessment & Plan Note (Addendum)
Well controlled with diet... statin contraindicated given PBC.

## 2019-05-14 NOTE — Assessment & Plan Note (Addendum)
With weight loss. She is having lows in AMs..Still eats a lot of chocolate... we will start with holding nighttime dose of metformin.  May be able to stop metformin all together.

## 2019-05-14 NOTE — Progress Notes (Signed)
Chief Complaint  Patient presents with  . Medicare Wellness    History of Present Illness: HPI   The patient presents for annual medicare wellness, complete physical and review of chronic health problems. He/She also has the following acute concerns today:  I have personally reviewed the Medicare Annual Wellness questionnaire and have noted 1. The patient's medical and social history 2. Their use of alcohol, tobacco or illicit drugs 3. Their current medications and supplements 4. The patient's functional ability including ADL's, fall risks, home safety risks and hearing or visual             impairment. 5. Diet and physical activities 6. Evidence for depression or mood disorders 7.         Updated provider list Cognitive evaluation was performed and recorded on pt medicare questionnaire form. The patients weight, height, BMI and visual acuity have been recorded in the chart  I have made referrals, counseling and provided education to the patient based review of the above and I have provided the pt with a written personalized care plan for preventive services.   Documentation of this information was scanned into the electronic record under the media tab.   Advance directives and end of life planning reviewed in detail with patient and documented in EMR. Patient given handout on advance care directives if needed. HCPOA and living will updated if needed.   Hearing Screening   125Hz  250Hz  500Hz  1000Hz  2000Hz  3000Hz  4000Hz  6000Hz  8000Hz   Right ear:           Left ear:           Comments: Wears bilateral hearing aides  Vision Screening Comments: W[facility]es-[facility] with Dr. Jeni Salles.  Sees eye doctor twice a year.   Fall Risk  05/14/2019 06/06/2018 05/31/2017 05/15/2016 03/22/2015  Falls in the past year? 0 0 No No No    PHQ9 SCORE ONLY 05/14/2019 06/06/2018 05/31/2017  Score 0 0 0    Hypertension:   Good control on lisinopril HCTZ. BP Readings from Last 3  Encounters:  05/14/19 110/62  06/10/18 136/80  10/23/17 140/84  Using medication without problems or lightheadedness: none Chest pain with exertion: none Edema:none Short of breath: none Average home BPs: Other issues:   Diabetes:   Great control on metformin Wt Readings from Last 3 Encounters:  05/14/19 172 lb (78 kg)  06/10/18 176 lb 2 oz (79.9 kg)  09/25/17 179 lb (81.2 kg)   Has had eye exam and no ulcers on feet.  Lab Results  Component Value Date   HGBA1C 6.2 05/06/2019  Using medications without difficulties: Hypoglycemic episodes: occ 60.. once a week Hyperglycemic episodes: Feet problems: Blood Sugars averaging: FBS 90-110 , 2 hours after meals 127 eye exam within last year:  Elevated Cholesterol:  At goal LDL , 100 Lab Results  Component Value Date   CHOL 169 05/06/2019   HDL 61.20 05/06/2019   LDLCALC 92 05/06/2019   TRIG 78.0 05/06/2019   CHOLHDL 3 05/06/2019  Using medications without problems: Muscle aches:  Diet compliance: moderate Exercise: walking Other complaints:   GAD:  She has been under a lot of stress in last year. Stable.. using alprazolam on limited basis.   PBC: Stable on Actigall for years. Followed by Dr. Loletha Grayer at [facility]     This visit occurred during the SARS-CoV-2 public health emergency.  Safety protocols were in place, including screening questions prior to the visit, additional usage of staff PPE,  and extensive cleaning of exam room while observing appropriate contact time as indicated for disinfecting solutions.   COVID 19 screen:  No recent travel or known exposure to COVID19 The patient denies respiratory symptoms of COVID 19 at this time. The importance of social distancing was discussed today.     Review of Systems  Constitutional: Negative for chills and fever.  HENT: Negative for congestion and ear pain.   Eyes: Negative for pain and redness.  Respiratory: Negative for cough and shortness of breath.    Cardiovascular: Negative for chest pain, palpitations and leg swelling.  Gastrointestinal: Negative for abdominal pain, blood in stool, constipation, diarrhea, nausea and vomiting.  Genitourinary: Negative for dysuria.  Musculoskeletal: Negative for falls and myalgias.  Skin: Negative for rash.  Neurological: Negative for dizziness.  Psychiatric/Behavioral: Negative for depression. The patient is not nervous/anxious.       Past Medical History:  Diagnosis Date  . Diabetes mellitus ([place])   . Environmental allergies   . Hypertension   . Liver failure ([place])   . Pancreatitis     reports that she has never smoked. She has never used smokeless tobacco. She reports current alcohol use. She reports that she does not use drugs.   Current Outpatient Medications:  .  ALPRAZolam (XANAX) 0.25 MG tablet, TAKE ONE TABLET BY MOUTH DAILY AS NEEDED, Disp: 90 tablet, Rfl: 0 .  glucose blood (FREESTYLE TEST STRIPS) test strip, USE TO TEST BLOOD SUGAR ONCE DAILY, Disp: 100 strip, Rfl: 3 .  hydrochlorothiazide (HYDRODIURIL) 25 MG tablet, Take 25 mg by mouth daily., Disp: , Rfl:  .  Lancets (FREESTYLE) lancets, USE TO CHECK BLOOD SUGAR ONE TIME A DAY AS DIRECTED, Disp: 100 each, Rfl: 3 .  lisinopril (PRINIVIL,ZESTRIL) 10 MG tablet, Take 10 mg by mouth 2 (two) times daily. , Disp: , Rfl:  .  metFORMIN (GLUCOPHAGE) 500 MG tablet, TAKE ONE TABLET BY MOUTH TWICE A DAY WITH FOOD, Disp: 180 tablet, Rfl: 1 .  ursodiol (ACTIGALL) 300 MG capsule, , Disp: , Rfl:    Observations/Objective: Blood pressure 110/62, pulse 78, temperature (!) 97.1 F (36.2 C), temperature source Temporal, height 5' 4.75" (1.645 m), weight 172 lb (78 kg), SpO2 97 %.  Physical Exam Constitutional:      General: She is not in acute distress.    Appearance: Normal appearance. She is well-developed. She is not ill-appearing or toxic-appearing.  HENT:     Head: Normocephalic.     Right Ear: Hearing, tympanic membrane, ear canal and  external ear normal.     Left Ear: Hearing, tympanic membrane, ear canal and external ear normal.     Nose: Nose normal.  Eyes:     General: Lids are normal. Lids are everted, no foreign bodies appreciated.     Conjunctiva/sclera: Conjunctivae normal.     Pupils: Pupils are equal, round, and reactive to light.  Neck:     Thyroid: No thyroid mass or thyromegaly.     Vascular: No carotid bruit.     Trachea: Trachea normal.  Cardiovascular:     Rate and Rhythm: Normal rate and regular rhythm.     Heart sounds: Normal heart sounds, S1 normal and S2 normal. No murmur. No gallop.   Pulmonary:     Effort: Pulmonary effort is normal. No respiratory distress.     Breath sounds: Normal breath sounds. No wheezing, rhonchi or rales.  Abdominal:     General: Bowel sounds are normal. There is no distension or abdominal  bruit.     Palpations: Abdomen is soft. There is no fluid wave or mass.     Tenderness: There is no abdominal tenderness. There is no guarding or rebound.     Hernia: No hernia is present.  Musculoskeletal:     Cervical back: Normal range of motion and neck supple.  Lymphadenopathy:     Cervical: No cervical adenopathy.  Skin:    General: Skin is warm and dry.     Findings: No rash.  Neurological:     Mental Status: She is alert.     Cranial Nerves: No cranial nerve deficit.     Sensory: No sensory deficit.  Psychiatric:        Mood and Affect: Mood is not anxious or depressed.        Speech: Speech normal.        Behavior: Behavior normal. Behavior is cooperative.        Judgment: Judgment normal.      Diabetic foot exam: Normal inspection No skin breakdown No calluses  Normal DP pulses Normal sensation to light touch and monofilament Nails normal  Assessment and Plan   The patient's preventative maintenance and recommended screening tests for an annual wellness exam were reviewed in full today. Brought up to date unless services declined.  Counselled on the  importance of diet, exercise, and its role in overall health and mortality. The patient's FH and SH was reviewed, including their home life, tobacco status, and drug and alcohol status.    Vaccines:Due for tdap but this is recommended against given liver issues, Uptodate with zoster, PNAand flu.  Has complete COVID vaccine. Colon:Brodie, 07/2004 colonoscopy, Neg stool hemeoccult 2017,  No further needed. Mammo: 10/2017.. may consider stopping as would not likely do chemo. DEXA: allergic to calcium, overdue for eval. She is not interested at this time.05/14/19  Hyperlipidemia Well controlled with diet... statin contraindicated given PBC.   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 Followed by Dr. Loletha Grayer at [facility]   DM (diabetes mellitus), secondary ([place])  With weight loss. She is having lows in AMs..Still eats a lot of chocolate... we will start with holding nighttime dose of metformin.  May be able to stop metformin all together.  Essential hypertension, benign Well controlled. Continue current medication.   Generalized anxiety disorder  Stable control per pt.   Eliezer Lofts, MD

## 2019-05-14 NOTE — Patient Instructions (Addendum)
Stop nighttime dose of metformin. Continue daily one.. if still having  Sugars < 70.. call.  Increase water intake as able.

## 2019-05-14 NOTE — Assessment & Plan Note (Signed)
Well controlled. Continue current medication.  

## 2019-05-14 NOTE — Assessment & Plan Note (Signed)
Stable on Actigall for years. Followed by Dr. Brady at [facility]  

## 2019-05-27 ENCOUNTER — Telehealth: Payer: Medicare Other

## 2019-06-20 ENCOUNTER — Other Ambulatory Visit: Payer: Self-pay | Admitting: Family Medicine

## 2019-07-03 ENCOUNTER — Other Ambulatory Visit: Payer: Self-pay | Admitting: Family Medicine

## 2019-07-03 NOTE — Telephone Encounter (Signed)
Last office visit 05/14/2019 for [place].  Last refilled 03/24/2019 for #90 with no refills.  CPE scheduled for 05/17/2020.

## 2019-07-07 DIAGNOSIS — E119 Type 2 diabetes mellitus without complications: Secondary | ICD-10-CM | POA: Diagnosis not present

## 2019-07-07 LAB — HM DIABETES EYE EXAM

## 2019-07-10 ENCOUNTER — Encounter: Payer: Self-pay | Admitting: Family Medicine

## 2019-09-28 ENCOUNTER — Other Ambulatory Visit: Payer: Self-pay | Admitting: Family Medicine

## 2019-10-01 ENCOUNTER — Other Ambulatory Visit: Payer: Self-pay | Admitting: Family Medicine

## 2019-10-02 NOTE — Telephone Encounter (Signed)
Pt requesting a refill of Xanax. Last seen on 05/14/19. Last Refilled on 07/03/2019.

## 2019-10-13 DIAGNOSIS — M545 Low back pain: Secondary | ICD-10-CM | POA: Diagnosis not present

## 2019-10-13 DIAGNOSIS — M5416 Radiculopathy, lumbar region: Secondary | ICD-10-CM | POA: Diagnosis not present

## 2019-10-20 DIAGNOSIS — M5116 Intervertebral disc disorders with radiculopathy, lumbar region: Secondary | ICD-10-CM | POA: Diagnosis not present

## 2019-10-20 DIAGNOSIS — M6281 Muscle weakness (generalized): Secondary | ICD-10-CM | POA: Diagnosis not present

## 2019-10-21 DIAGNOSIS — Z23 Encounter for immunization: Secondary | ICD-10-CM | POA: Diagnosis not present

## 2019-10-21 DIAGNOSIS — M25562 Pain in left knee: Secondary | ICD-10-CM | POA: Diagnosis not present

## 2019-10-21 DIAGNOSIS — M25561 Pain in right knee: Secondary | ICD-10-CM | POA: Diagnosis not present

## 2019-10-27 DIAGNOSIS — M6281 Muscle weakness (generalized): Secondary | ICD-10-CM | POA: Diagnosis not present

## 2019-10-27 DIAGNOSIS — M5116 Intervertebral disc disorders with radiculopathy, lumbar region: Secondary | ICD-10-CM | POA: Diagnosis not present

## 2019-11-06 DIAGNOSIS — Z23 Encounter for immunization: Secondary | ICD-10-CM | POA: Diagnosis not present

## 2019-12-08 DIAGNOSIS — K743 Primary biliary cirrhosis: Secondary | ICD-10-CM | POA: Diagnosis not present

## 2019-12-22 ENCOUNTER — Other Ambulatory Visit: Payer: Self-pay | Admitting: Family Medicine

## 2019-12-29 ENCOUNTER — Other Ambulatory Visit: Payer: Self-pay | Admitting: Family Medicine

## 2019-12-29 NOTE — Telephone Encounter (Signed)
Last office visit 05/14/2019 for [place].  Last refilled 10/02/2019 for #90 with no refills.  CPE scheduled for 05/17/2020.

## 2020-01-12 DIAGNOSIS — E119 Type 2 diabetes mellitus without complications: Secondary | ICD-10-CM | POA: Diagnosis not present

## 2020-02-05 DIAGNOSIS — E038 Other specified hypothyroidism: Secondary | ICD-10-CM | POA: Diagnosis not present

## 2020-02-05 DIAGNOSIS — E7800 Pure hypercholesterolemia, unspecified: Secondary | ICD-10-CM | POA: Diagnosis not present

## 2020-02-05 DIAGNOSIS — I10 Essential (primary) hypertension: Secondary | ICD-10-CM | POA: Diagnosis not present

## 2020-04-11 ENCOUNTER — Other Ambulatory Visit: Payer: Self-pay | Admitting: *Deleted

## 2020-04-11 MED ORDER — ALPRAZOLAM 0.25 MG PO TABS: 0.2500 mg | ORAL_TABLET | Freq: Every day | ORAL | 0 refills | Status: DC | PRN

## 2020-04-11 NOTE — Telephone Encounter (Signed)
Last office visit 05/14/2019 for [place].  Last refilled 12/29/2019 fjor #90 with no refills.  CPE scheduled 05/17/2020.

## 2020-04-28 DIAGNOSIS — L820 Inflamed seborrheic keratosis: Secondary | ICD-10-CM | POA: Diagnosis not present

## 2020-04-28 DIAGNOSIS — L570 Actinic keratosis: Secondary | ICD-10-CM | POA: Diagnosis not present

## 2020-05-02 LAB — HM DIABETES FOOT EXAM

## 2020-05-04 ENCOUNTER — Telehealth: Payer: Self-pay | Admitting: Family Medicine

## 2020-05-04 DIAGNOSIS — Z1159 Encounter for screening for other viral diseases: Secondary | ICD-10-CM

## 2020-05-04 DIAGNOSIS — E139 Other specified diabetes mellitus without complications: Secondary | ICD-10-CM

## 2020-05-04 NOTE — Telephone Encounter (Signed)
-----   Message from Ellamae Sia sent at 04/25/2020 12:47 PM EDT ----- Regarding: lab orders for Tuesday, 4.12.22 Patient is scheduled for CPX labs, please order future labs, Thanks , Karna Christmas

## 2020-05-10 ENCOUNTER — Other Ambulatory Visit: Payer: Medicare Other

## 2020-05-11 ENCOUNTER — Other Ambulatory Visit (INDEPENDENT_AMBULATORY_CARE_PROVIDER_SITE_OTHER): Payer: Medicare Other

## 2020-05-11 ENCOUNTER — Other Ambulatory Visit: Payer: Self-pay

## 2020-05-11 DIAGNOSIS — E139 Other specified diabetes mellitus without complications: Secondary | ICD-10-CM

## 2020-05-11 DIAGNOSIS — Z1159 Encounter for screening for other viral diseases: Secondary | ICD-10-CM

## 2020-05-11 LAB — COMPREHENSIVE METABOLIC PANEL
ALT: 10 U/L (ref 0–35)
AST: 17 U/L (ref 0–37)
Albumin: 4.2 g/dL (ref 3.5–5.2)
Alkaline Phosphatase: 63 U/L (ref 39–117)
BUN: 26 mg/dL — ABNORMAL HIGH (ref 6–23)
CO2: 26 mEq/L (ref 19–32)
Calcium: 9.9 mg/dL (ref 8.4–10.5)
Chloride: 103 mEq/L (ref 96–112)
Creatinine, Ser: 1.03 mg/dL (ref 0.40–1.20)
GFR: 51.81 mL/min — ABNORMAL LOW (ref >60.00)
Glucose, Bld: 105 mg/dL — ABNORMAL HIGH (ref 70–99)
Potassium: 4.1 mEq/L (ref 3.5–5.1)
Sodium: 137 mEq/L (ref 135–145)
Total Bilirubin: 0.5 mg/dL (ref 0.2–1.2)
Total Protein: 7.2 g/dL (ref 6.0–8.3)

## 2020-05-11 LAB — LIPID PANEL
Cholesterol: 158 mg/dL (ref 0–200)
HDL: 59.5 mg/dL (ref >39.00)
LDL Cholesterol: 73 mg/dL (ref 0–99)
NonHDL: 98.77
Total CHOL/HDL Ratio: 3
Triglycerides: 128 mg/dL (ref 0.0–149.0)
VLDL: 25.6 mg/dL (ref 0.0–40.0)

## 2020-05-11 LAB — HEMOGLOBIN A1C: Hgb A1c MFr Bld: 6.4 % (ref 4.6–6.5)

## 2020-05-12 ENCOUNTER — Other Ambulatory Visit (INDEPENDENT_AMBULATORY_CARE_PROVIDER_SITE_OTHER): Payer: Medicare Other

## 2020-05-12 DIAGNOSIS — K743 Primary biliary cirrhosis: Secondary | ICD-10-CM | POA: Diagnosis not present

## 2020-05-12 LAB — HEPATIC FUNCTION PANEL
ALT: 9 U/L (ref 0–35)
AST: 17 U/L (ref 0–37)
Albumin: 4.2 g/dL (ref 3.5–5.2)
Alkaline Phosphatase: 62 U/L (ref 39–117)
Bilirubin, Direct: 0.1 mg/dL (ref 0.0–0.3)
Total Bilirubin: 0.4 mg/dL (ref 0.2–1.2)
Total Protein: 7.3 g/dL (ref 6.0–8.3)

## 2020-05-12 LAB — HEPATITIS C ANTIBODY
Hepatitis C Ab: NONREACTIVE
SIGNAL TO CUT-OFF: 0 (ref <1.00)

## 2020-05-12 NOTE — Progress Notes (Signed)
No critical labs need to be addressed urgently. We will discuss labs in detail at upcoming office visit.   

## 2020-05-17 ENCOUNTER — Other Ambulatory Visit: Payer: Self-pay

## 2020-05-17 ENCOUNTER — Ambulatory Visit (INDEPENDENT_AMBULATORY_CARE_PROVIDER_SITE_OTHER): Payer: Medicare Other | Admitting: Family Medicine

## 2020-05-17 ENCOUNTER — Encounter: Payer: Self-pay | Admitting: Family Medicine

## 2020-05-17 VITALS — BP 120/80 | HR 87 | Temp 97.6°F | Ht 64.75 in | Wt 172.5 lb

## 2020-05-17 DIAGNOSIS — E785 Hyperlipidemia, unspecified: Secondary | ICD-10-CM | POA: Diagnosis not present

## 2020-05-17 DIAGNOSIS — E1169 Type 2 diabetes mellitus with other specified complication: Secondary | ICD-10-CM | POA: Diagnosis not present

## 2020-05-17 DIAGNOSIS — Z5309 Procedure and treatment not carried out because of other contraindication: Secondary | ICD-10-CM

## 2020-05-17 DIAGNOSIS — F411 Generalized anxiety disorder: Secondary | ICD-10-CM | POA: Diagnosis not present

## 2020-05-17 DIAGNOSIS — K743 Primary biliary cirrhosis: Secondary | ICD-10-CM

## 2020-05-17 DIAGNOSIS — E1159 Type 2 diabetes mellitus with other circulatory complications: Secondary | ICD-10-CM

## 2020-05-17 DIAGNOSIS — I152 Hypertension secondary to endocrine disorders: Secondary | ICD-10-CM

## 2020-05-17 DIAGNOSIS — Z0000 Encounter for general adult medical examination without abnormal findings: Secondary | ICD-10-CM | POA: Diagnosis not present

## 2020-05-17 NOTE — Assessment & Plan Note (Signed)
Daily use of alprazolam.. not able to decrease but also not escalating dose.  Reports tolerable control of mood. Has been on for 15 years.  Intolerant of SSRIs in past.

## 2020-05-17 NOTE — Assessment & Plan Note (Signed)
Stable on Actigall for years. Followed by Dr. Loletha Grayer at [facility]

## 2020-05-17 NOTE — Progress Notes (Signed)
Patient ID: [name], female    DOB: November 23, 1940, 80 y.o.   MRN: [id number]  This visit was conducted in person.  BP 120/80   Pulse 87   Temp 97.6 F (36.4 C) (Temporal)   Ht 5' 4.75" (1.645 m)   Wt 172 lb 8 oz (78.2 kg)   SpO2 100%   BMI 28.93 kg/m    CC:  Chief Complaint  Patient presents with  . Medicare Wellness    Subjective:   HPI: BREANDA Carroll is a 80 y.o. female presenting on 05/17/2020 for Medicare Wellness  The patient presents for annual medicare wellness, complete physical and review of chronic health problems. He/She also has the following acute concerns today:  I have personally reviewed the Medicare Annual Wellness questionnaire and have noted 1. The patient's medical and social history 2. Their use of alcohol, tobacco or illicit drugs 3. Their current medications and supplements 4. The patient's functional ability including ADL's, fall risks, home safety risks and hearing or visual             impairment. 5. Diet and physical activities 6. Evidence for depression or mood disorders 7.         Updated provider list Cognitive evaluation was performed and recorded on pt medicare questionnaire form. The patients weight, height, BMI and visual acuity have been recorded in the chart  I have made referrals, counseling and provided education to the patient based review of the above and I have provided the pt with a written personalized care plan for preventive services.   Documentation of this information was scanned into the electronic record under the media tab.   Advance directives and end of life planning reviewed in detail with patient and documented in EMR. Patient given handout on advance care directives if needed. HCPOA and living will updated if needed.   Hearing Screening   125Hz  250Hz  500Hz  1000Hz  2000Hz  3000Hz  4000Hz  6000Hz  8000Hz   Right ear:           Left ear:           Comments: Wears Bilateral Hearing Aides  Vision Screening Comments:  Wears Glasses-Eye Exam with Dr. Jeni Salles 01/2020.  Sees eye doctor every 6 months.  Fall Risk  05/17/2020 05/14/2019 06/06/2018 05/31/2017 05/15/2016  Falls in the past year? 1 0 0 No No  Number falls in past yr: 0 - - - -  Injury with Fall? 0 - - - -   Due to ice.  Diabetes:  At goal on metformin 500 mg BID. Lab Results  Component Value Date   HGBA1C 6.4 05/11/2020  Using medications without difficulties: Hypoglycemic episodes: rare Hyperglycemic episodes: none Feet problems: no ulcer Blood Sugars averaging: FBS 67-117 eye exam within last year: yes  Hypertension:  Good control on HCTZ linsinopril BP Readings from Last 3 Encounters:  05/17/20 120/80  05/14/19 110/62  06/10/18 136/80  Using medication without problems or lightheadedness:  none Chest pain with exertion: none Edema: none Short of breath: none Average home BPs: Other issues:  Elevated Cholesterol:  LDL at goal < 100 , statin contraindicated given PBC Lab Results  Component Value Date   CHOL 158 05/11/2020   HDL 59.50 05/11/2020   LDLCALC 73 05/11/2020   TRIG 128.0 05/11/2020   CHOLHDL 3 05/11/2020  Using medications without problems: Muscle aches:  Diet compliance: moderate Exercise: doing house work, some yard work  Has not been going to pool given pandemic. Other complaints:  Primary biliary cholangitis: Stable on Actigall for years. Followed by Dr. Loletha Grayer at [facility]  GAD:  She is using alprazolam  Daily  # 90.  SE to prozac in  Past. [place] Office Visit from 05/17/2020 in [place] at [facility] Total Score 0          Relevant past medical, surgical, family and social history reviewed and updated as indicated. Interim medical history since our last visit reviewed. Allergies and medications reviewed and updated. Outpatient Medications Prior to Visit  Medication Sig Dispense Refill  . ALPRAZolam (XANAX) 0.25 MG tablet Take 1 tablet (0.25 mg total) by mouth daily as needed. 90  tablet 0  . FREESTYLE TEST STRIPS test strip USE TO TEST BLOOD SUGAR ONCE DAILY 100 strip 3  . hydrochlorothiazide (HYDRODIURIL) 25 MG tablet Take 25 mg by mouth daily.    . Lancets (FREESTYLE) lancets USE TO CHECK BLOOD SUGAR ONE TIME A DAY AS DIRECTED 100 each 3  . lisinopril (PRINIVIL,ZESTRIL) 10 MG tablet Take 10 mg by mouth 2 (two) times daily.     . metFORMIN (GLUCOPHAGE) 500 MG tablet TAKE ONE TABLET BY MOUTH TWICE A DAY WITH FOOD 180 tablet 1  . ursodiol (ACTIGALL) 300 MG capsule Take 2 capsules by mouth in the morning and 1 capsule by mouth in the evening     No facility-administered medications prior to visit.     Per HPI unless specifically indicated in ROS section below Review of Systems  Constitutional: Negative for fatigue and fever.  HENT: Negative for congestion.   Eyes: Negative for pain.  Respiratory: Negative for cough and shortness of breath.   Cardiovascular: Negative for chest pain, palpitations and leg swelling.  Gastrointestinal: Negative for abdominal pain.  Genitourinary: Negative for dysuria and vaginal bleeding.  Musculoskeletal: Negative for back pain.  Neurological: Negative for syncope, light-headedness and headaches.  Psychiatric/Behavioral: Negative for dysphoric mood.   Objective:  BP 120/80   Pulse 87   Temp 97.6 F (36.4 C) (Temporal)   Ht 5' 4.75" (1.645 m)   Wt 172 lb 8 oz (78.2 kg)   SpO2 100%   BMI 28.93 kg/m   Wt Readings from Last 3 Encounters:  05/17/20 172 lb 8 oz (78.2 kg)  05/14/19 172 lb (78 kg)  06/10/18 176 lb 2 oz (79.9 kg)      Physical Exam Constitutional:      General: She is not in acute distress.    Appearance: Normal appearance. She is well-developed. She is obese. She is not ill-appearing or toxic-appearing.  HENT:     Head: Normocephalic.     Right Ear: Hearing, tympanic membrane, ear canal and external ear normal.     Left Ear: Hearing, tympanic membrane, ear canal and external ear normal.     Nose: Nose  normal.  Eyes:     General: Lids are normal. Lids are everted, no foreign bodies appreciated.     Conjunctiva/sclera: Conjunctivae normal.     Pupils: Pupils are equal, round, and reactive to light.  Neck:     Thyroid: No thyroid mass or thyromegaly.     Vascular: No carotid bruit.     Trachea: Trachea normal.  Cardiovascular:     Rate and Rhythm: Normal rate and regular rhythm.     Heart sounds: Normal heart sounds, S1 normal and S2 normal. No murmur heard. No gallop.   Pulmonary:     Effort: Pulmonary effort is normal. No respiratory distress.  Breath sounds: Normal breath sounds. No wheezing, rhonchi or rales.  Abdominal:     General: Bowel sounds are normal. There is no distension or abdominal bruit.     Palpations: Abdomen is soft. There is no fluid wave or mass.     Tenderness: There is no abdominal tenderness. There is no guarding or rebound.     Hernia: No hernia is present.  Musculoskeletal:     Cervical back: Normal range of motion and neck supple.  Lymphadenopathy:     Cervical: No cervical adenopathy.  Skin:    General: Skin is warm and dry.     Findings: No rash.  Neurological:     Mental Status: She is alert.     Cranial Nerves: No cranial nerve deficit.     Sensory: No sensory deficit.  Psychiatric:        Mood and Affect: Mood is not anxious or depressed.        Speech: Speech normal.        Behavior: Behavior normal. Behavior is cooperative.        Judgment: Judgment normal.      Diabetic foot exam: Normal inspection No skin breakdown No calluses  Normal DP pulses Normal sensation to light touch and monofilament Nails normal     Results for orders placed or performed in visit on 05/12/20  Hepatic function panel  Result Value Ref Range   Total Bilirubin 0.4 0.2 - 1.2 mg/dL   Bilirubin, Direct 0.1 0.0 - 0.3 mg/dL   Alkaline Phosphatase 62 39 - 117 U/L   AST 17 0 - 37 U/L   ALT 9 0 - 35 U/L   Total Protein 7.3 6.0 - 8.3 g/dL   Albumin 4.2 3.5  - 5.2 g/dL    This visit occurred during the SARS-CoV-2 public health emergency.  Safety protocols were in place, including screening questions prior to the visit, additional usage of staff PPE, and extensive cleaning of exam room while observing appropriate contact time as indicated for disinfecting solutions.   COVID 19 screen:  No recent travel or known exposure to COVID19 The patient denies respiratory symptoms of COVID 19 at this time. The importance of social distancing was discussed today.   Assessment and Plan   The patient's preventative maintenance and recommended screening tests for an annual wellness exam were reviewed in full today. Brought up to date unless services declined.  Counselled on the importance of diet, exercise, and its role in overall health and mortality. The patient's FH and SH was reviewed, including their home life, tobacco status, and drug and alcohol status.   Vaccines:Due for tdap but this is recommended against given liver issues, Uptodate with zoster, PNAand flu.  Has complete COVID vaccine x 3 Colon:[place]rod[place], 07/2004 colonoscopy, Neg stool hemeoccult 2017,  No further needed. Mammo: 10/2017... she is not interested in continuing as she would not treat. DEXA: allergic to calcium, overdue for eval. She is not interested at this time.05/17/20  Problem List Items Addressed This Visit    Generalized anxiety disorder    Daily use of alprazolam.. not able to decrease but also not escalating dose.  Reports tolerable control of mood. Has been on for 15 years.  Intolerant of SSRIs in past.      Hyperlipidemia associated with type 2 diabetes mellitus ([place]) (Chronic)    Well controlled, statin contraindicated.      Hypertension associated with diabetes ([place]) (Chronic)    Stable, chronic.  Continue current medication. Electrolytes  stable.  Lisinopril 10 mg BID  HCTZ 25 mg daily.      Medicare annual wellness visit, subsequent - Primary    Primary biliary cholangitis ([place]) (Chronic)    Stable on Actigall for years. Followed by Dr. 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  Continue current medication.   Metformin 500 mg BID        PDMP reviewed during this encounter.   Eliezer Lofts, MD

## 2020-05-17 NOTE — Assessment & Plan Note (Signed)
Stable, chronic.  Continue current medication. Electrolytes stable.  Lisinopril 10 mg BID  HCTZ 25 mg daily.

## 2020-05-17 NOTE — Assessment & Plan Note (Signed)
Stable, chronic.  Continue current medication.   Metformin 500 mg BID

## 2020-05-17 NOTE — Patient Instructions (Signed)
Preventive Care 79 Years and Older, Female Preventive care refers to lifestyle choices and visits with your health care provider that can promote health and wellness. This includes:  A yearly physical exam. This is also called an annual wellness visit.  Regular dental and eye exams.  Immunizations.  Screening for certain conditions.  Healthy lifestyle choices, such as: ? Eating a healthy diet. ? Getting regular exercise. ? Not using drugs or products that contain nicotine and tobacco. ? Limiting alcohol use. What can I expect for my preventive care visit? Physical exam Your health care provider will check your:  Height and weight. These may be used to calculate your BMI (body mass index). BMI is a measurement that tells if you are at a healthy weight.  Heart rate and blood pressure.  Body temperature.  Skin for abnormal spots. Counseling Your health care provider may ask you questions about your:  Past medical problems.  Family's medical history.  Alcohol, tobacco, and drug use.  Emotional well-being.  Home life and relationship well-being.  Sexual activity.  Diet, exercise, and sleep habits.  History of falls.  Memory and ability to understand (cognition).  Work and work environment.  Pregnancy and menstrual history.  Access to firearms. What immunizations do I need? Vaccines are usually given at various ages, according to a schedule. Your health care provider will recommend vaccines for you based on your age, medical history, and lifestyle or other factors, such as travel or where you work.   What tests do I need? Blood tests  Lipid and cholesterol levels. These may be checked every 5 years, or more often depending on your overall health.  Hepatitis C test.  Hepatitis B test. Screening  Lung cancer screening. You may have this screening every year starting at age 55 if you have a 30-pack-year history of smoking and currently smoke or have quit within  the past 15 years.  Colorectal cancer screening. ? All adults should have this screening starting at age 50 and continuing until age 75. ? Your health care provider may recommend screening at age 45 if you are at increased risk. ? You will have tests every 1-10 years, depending on your results and the type of screening test.  Diabetes screening. ? This is done by checking your blood sugar (glucose) after you have not eaten for a while (fasting). ? You may have this done every 1-3 years.  Mammogram. ? This may be done every 1-2 years. ? Talk with your health care provider about how often you should have regular mammograms.  Abdominal aortic aneurysm (AAA) screening. You may need this if you are a current or former smoker.  BRCA-related cancer screening. This may be done if you have a family history of breast, ovarian, tubal, or peritoneal cancers. Other tests  STD (sexually transmitted disease) testing, if you are at risk.  Bone density scan. This is done to screen for osteoporosis. You may have this done starting at age 65. Talk with your health care provider about your test results, treatment options, and if necessary, the need for more tests. Follow these instructions at home: Eating and drinking  Eat a diet that includes fresh fruits and vegetables, whole grains, lean protein, and low-fat dairy products. Limit your intake of foods with high amounts of sugar, saturated fats, and salt.  Take vitamin and mineral supplements as recommended by your health care provider.  Do not drink alcohol if your health care provider tells you not to drink.    If you drink alcohol: ? Limit how much you have to 0-1 drink a day. ? Be aware of how much alcohol is in your drink. In the [place] one drink equals one 12 oz bottle of beer (355 mL), one 5 oz glass of wine (148 mL), or one 1 oz glass of hard liquor (44 mL).   Lifestyle  Take daily care of your teeth and gums. Brush your teeth every morning  and night with fluoride toothpaste. Floss one time each day.  Stay active. Exercise for at least 30 minutes 5 or more days each week.  Do not use any products that contain nicotine or tobacco, such as cigarettes, e-cigarettes, and chewing tobacco. If you need help quitting, ask your health care provider.  Do not use drugs.  If you are sexually active, practice safe sex. Use a condom or other form of protection in order to prevent STIs (sexually transmitted infections).  Talk with your health care provider about taking a low-dose aspirin or statin.  Find healthy ways to cope with stress, such as: ? Meditation, yoga, or listening to music. ? Journaling. ? Talking to a trusted person. ? Spending time with friends and family. Safety  Always wear your seat belt while driving or riding in a vehicle.  Do not drive: ? If you have been drinking alcohol. Do not ride with someone who has been drinking. ? When you are tired or distracted. ? While texting.  Wear a helmet and other protective equipment during sports activities.  If you have firearms in your house, make sure you follow all gun safety procedures. What's next?  Visit your health care provider once a year for an annual wellness visit.  Ask your health care provider how often you should have your eyes and teeth checked.  Stay up to date on all vaccines. This information is not intended to replace advice given to you by your health care provider. Make sure you discuss any questions you have with your health care provider. Document Revised: 01/06/2020 Document Reviewed: 01/09/2018 Elsevier Patient Education  2021 Elsevier Inc.  

## 2020-05-17 NOTE — Assessment & Plan Note (Signed)
Well controlled, statin contraindicated.

## 2020-06-24 ENCOUNTER — Other Ambulatory Visit: Payer: Self-pay | Admitting: Family Medicine

## 2020-06-24 MED ORDER — ALPRAZOLAM 0.25 MG PO TABS: 0.2500 mg | ORAL_TABLET | Freq: Every day | ORAL | 0 refills | Status: DC | PRN

## 2020-06-24 NOTE — Telephone Encounter (Signed)
She only needs 20 pills and then on June 14th she would need it to go 90 day LAST APPOINTMENT DATE: 05/17/2020   NEXT APPOINTMENT DATE:@Visit  date not found   MEDICATION: alprazolam  [facility]: [facility] [address]  Let patient know to contact pharmacy at the end of the day to make sure medication is ready.  Please notify patient to allow 48-72 hours to process  Encourage patient to contact the pharmacy for refills or they can request refills through [place]:   LAST REFILL:  QTY:  REFILL DATE:    OTHER COMMENTS:    Okay for refill?  Please advise

## 2020-06-24 NOTE — Addendum Note (Signed)
Addended by: Carter Kitten on: 06/24/2020 03:30 PM   Modules accepted: Orders

## 2020-07-05 ENCOUNTER — Telehealth: Payer: Self-pay

## 2020-07-05 MED ORDER — ALPRAZOLAM 0.25 MG PO TABS: 0.2500 mg | ORAL_TABLET | Freq: Every day | ORAL | 0 refills | Status: DC | PRN

## 2020-07-05 NOTE — Telephone Encounter (Signed)
Helyn App from [facility] in [place] left a message on triage line stating the alprazolam rx done 5-27 #20 was not enough o help bridge the pt to make all he medications due at the same time. She is asking if a new rx for #60 can be sent in.

## 2020-07-07 ENCOUNTER — Other Ambulatory Visit: Payer: Self-pay | Admitting: Family Medicine

## 2020-07-13 DIAGNOSIS — H353131 Nonexudative age-related macular degeneration, bilateral, early dry stage: Secondary | ICD-10-CM | POA: Diagnosis not present

## 2020-07-13 LAB — HM DIABETES EYE EXAM

## 2020-07-15 ENCOUNTER — Encounter: Payer: Self-pay | Admitting: Family Medicine

## 2020-07-20 DIAGNOSIS — R252 Cramp and spasm: Secondary | ICD-10-CM | POA: Diagnosis not present

## 2020-07-21 ENCOUNTER — Encounter: Payer: Self-pay | Admitting: Neurology

## 2020-08-22 DIAGNOSIS — M25562 Pain in left knee: Secondary | ICD-10-CM | POA: Diagnosis not present

## 2020-08-22 DIAGNOSIS — M25511 Pain in right shoulder: Secondary | ICD-10-CM | POA: Diagnosis not present

## 2020-08-22 DIAGNOSIS — M25561 Pain in right knee: Secondary | ICD-10-CM | POA: Diagnosis not present

## 2020-09-05 ENCOUNTER — Other Ambulatory Visit: Payer: Self-pay | Admitting: Family Medicine

## 2020-09-05 NOTE — Telephone Encounter (Signed)
Last office visit 05/17/2020 for [place].  Last refilled 07/05/2020 for #60 with no refills .  CPE scheduled for 05/19/2021.

## 2020-09-07 DIAGNOSIS — Z23 Encounter for immunization: Secondary | ICD-10-CM | POA: Diagnosis not present

## 2020-09-12 MED ORDER — ALPRAZOLAM 0.25 MG PO TABS: ORAL_TABLET | ORAL | 0 refills | Status: DC

## 2020-09-12 NOTE — Telephone Encounter (Signed)
Received fax from [facility] stating patient requested a 90 day supply to sync with other meds if appropriate. Refill was sent in on 09/06/2020 for #60.

## 2020-09-19 ENCOUNTER — Other Ambulatory Visit: Payer: Self-pay

## 2020-09-19 ENCOUNTER — Encounter: Payer: Self-pay | Admitting: Neurology

## 2020-09-19 ENCOUNTER — Ambulatory Visit (INDEPENDENT_AMBULATORY_CARE_PROVIDER_SITE_OTHER): Payer: Medicare Other | Admitting: Neurology

## 2020-09-19 VITALS — BP 152/60 | HR 73 | Ht 67.0 in | Wt 178.0 lb

## 2020-09-19 DIAGNOSIS — M48062 Spinal stenosis, lumbar region with neurogenic claudication: Secondary | ICD-10-CM | POA: Diagnosis not present

## 2020-09-19 DIAGNOSIS — M4807 Spinal stenosis, lumbosacral region: Secondary | ICD-10-CM

## 2020-09-19 NOTE — Progress Notes (Signed)
[facility] Clinic Note - Initial Visit   Date: 09/19/20  [name] MRN: [id number] DOB: 07-06-1940   Dear Dr. Lucia Gaskins:  Thank you for your kind referral of [name] for consultation of muscle cramps. Although her history is well known to you, please allow [place] to reiterate it for the purpose of our medical record. The patient was accompanied to the clinic by self.     History of Present Illness: [name] is a 80 y.o. right-handed female with primary biliary cholangitis, hypertension, and hyperlipidemia presenting for evaluation of bilateral feet cramping.  For the past 3 years, she has noticed that her right 2nd toe crosses over towards the great toe. She also complains of bilateral feet cramping and low back pain. Pain is worse when layting on her side and very quikcly subsides when she lays on her back. She has back pain with prolonged standing or walking.  She has numbness over the lower leg and top of the feet. She is hoping to have her toe surgically corrected and was referred to be evaluate for any primary neurological condition which could be contributing to this.    Out-side paper records, electronic medical record, and images have been reviewed where available and summarized as:  MRI lumbar without contrast 03/08/2013: Grossly non-compressive disc bulges at L2-3, L3-4 and L4-5.  Disc bulge and facet degeneration at L5-S1. Foraminal narrowing because of encroachment by a osteophyte and disc material, more on  the right than the left. This could possibly affect the right L5  nerve root, though definite compression is not established.   Lab Results  Component Value Date   HGBA1C 6.4 05/11/2020    Past Medical History:  Diagnosis Date   Diabetes mellitus ([place])    Environmental allergies    Hypertension    Liver failure ([place])    Pancreatitis     Past Surgical History:  Procedure Laterality Date   CARPAL TUNNEL RELEASE  8/91    CHOLECYSTECTOMY  10/80   COLONOSCOPY     ERCP  3/95   fusion on fingers  2000 2001   INCONTINENCE SURGERY  09/17/00   KNEE ARTHROPLASTY  10/25/95   x2   KNEE ARTHROSCOPY  7/92, 93, 94   x3   LIVER BIOPSY  5/95   perforated colon  3/07   SHOULDER ARTHROSCOPY  3/92   spur cut muscle   sigmoid colon surgery  07/17/05     Medications:  Outpatient Encounter Medications as of 09/19/2020  Medication Sig   acetaminophen (TYLENOL) 325 MG tablet Take 650 mg by mouth every 4 (four) hours as needed.   ALPRAZolam (XANAX) 0.25 MG tablet TAKE ONE TABLET BY MOUTH ONE TIME DAILY AS NEEDED   cetirizine (ZYRTEC) 10 MG tablet Take 10 mg by mouth 2 (two) times daily.   FREESTYLE TEST STRIPS test strip USE TO TEST BLOOD SUGAR ONCE DAILY   hydrochlorothiazide (HYDRODIURIL) 25 MG tablet Take 25 mg by mouth daily.   ibuprofen (ADVIL) 200 MG tablet Take 200 mg by mouth every 6 (six) hours as needed.   Lancets (FREESTYLE) lancets USE TO CHECK BLOOD SUGAR ONE TIME A DAY AS DIRECTED   lisinopril (PRINIVIL,ZESTRIL) 10 MG tablet Take 10 mg by mouth 2 (two) times daily.    metFORMIN (GLUCOPHAGE) 500 MG tablet TAKE ONE TABLET BY MOUTH TWICE A DAY WITH FOOD   ursodiol (ACTIGALL) 300 MG capsule Take 2 capsules by mouth in the morning and 1 capsule  by mouth in the evening   No facility-administered encounter medications on file as of 09/19/2020.    Allergies:  Allergies  Allergen Reactions   Codeine Anaphylaxis    Bad respiratory problems   Demerol [Meperidine] Anaphylaxis    She thinks it has codeine in it    Talwin [Pentazocine] Anaphylaxis    Affects liver and respiratory problems   Tessalon [Benzonatate] Anaphylaxis    Trachea will get so inflammed   Vicodin [Hydrocodone-Acetaminophen] Anaphylaxis    Major respiratory   Betadine [Povidone Iodine]     Skin sloughes off   Tape    Calcium Channel Blockers Rash    Double vision and rash; cant even take CA2+ pills   Iodine Rash    Rash and respiratory    Neosporin [Neomycin-Bacitracin Zn-Polymyx] Rash   Sulfa Antibiotics Rash    Family History: Family History  Problem Relation Age of Onset   Colon cancer Neg Hx    Colon polyps Neg Hx     Social History: Social History   Tobacco Use   Smoking status: Never   Smokeless tobacco: Never  Substance Use Topics   Alcohol use: Yes    Alcohol/week: 0.0 standard drinks    Comment: 4oz wine 2 times yearly   Drug use: No   Social History   Social History Narrative   Right Handed    Lives in a one story home    Vital Signs:  BP (!) 152/60   Pulse 73   Ht '5\' 7"'$  (1.702 m)   Wt 178 lb (80.7 kg)   SpO2 98%   BMI 27.88 kg/m    Neurological Exam: MENTAL STATUS including orientation to time, place, person, recent and remote memory, attention span and concentration, language, and fund of knowledge is normal.  Speech is not dysarthric.  CRANIAL NERVES: II:  No visual field defects.    III-IV-VI: Pupils equal round and reactive to light.  Normal conjugate, extra-ocular eye movements in all directions of gaze.  No nystagmus.  No ptosis.   V:  Normal facial sensation.    VII:  Normal facial symmetry and movements.   VIII:  Normal hearing and vestibular function.   IX-X:  Normal palatal movement.   XI:  Normal shoulder shrug and head rotation.   XII:  Normal tongue strength and range of motion, no deviation or fasciculation.  MOTOR:  Motor strength is 5/5 throughout, except LUE has limited ROM around the shoulder.  Right 2nd toe with medial rotation deformity.  No atrophy, fasciculations or abnormal movements.  No pronator drift.    MSRs:  Right        Left                  brachioradialis 2+  2+  biceps 2+  2+  triceps 2+  2+  patellar 3+  3+  ankle jerk 1+  1+  Hoffman no  no  plantar response down  down   SENSORY:  Reduced vibration at the ankles. Pin prick and temperature intact throughout.  COORDINATION/GAIT: Normal finger-to- nose-finger  Intact rapid alternating  movements bilaterally.   Gait stooped and stable, unassisted.  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 I do not appreciate any spasticity in her feet and think that her toe crossover is separate pathology intrinsic feet, unrelated to her lumbar disease.  To investigate her symptoms, MRI lumbar spine wo contrast  will be ordered to evaluate for compressive pathology.  Further recommendations pending results.   Thank you for allowing me to participate in patient's care.  If I can answer any additional questions, I would be pleased to do so.    Sincerely,    Danthony Kendrix K. Posey Pronto, DO

## 2020-09-19 NOTE — Patient Instructions (Addendum)
MRI lumbar spine wo contrast will be ordered.  We will call you with the results.

## 2020-09-28 ENCOUNTER — Telehealth: Payer: Self-pay

## 2020-09-28 DIAGNOSIS — R252 Cramp and spasm: Secondary | ICD-10-CM | POA: Diagnosis not present

## 2020-09-28 DIAGNOSIS — M21371 Foot drop, right foot: Secondary | ICD-10-CM | POA: Diagnosis not present

## 2020-09-28 DIAGNOSIS — M2041 Other hammer toe(s) (acquired), right foot: Secondary | ICD-10-CM | POA: Diagnosis not present

## 2020-09-28 NOTE — Telephone Encounter (Signed)
Noted  

## 2020-09-28 NOTE — Telephone Encounter (Signed)
I contacted Patient regarding MRI lumbar appt at [facility] and no precert was needed. Patient went to Ortho today and is going to have everything done with him. All orders cancelled. Forwarding this to your review. Thanks

## 2020-09-30 ENCOUNTER — Other Ambulatory Visit: Payer: Self-pay | Admitting: Orthopaedic Surgery

## 2020-09-30 DIAGNOSIS — M545 Low back pain, unspecified: Secondary | ICD-10-CM

## 2020-09-30 DIAGNOSIS — M5416 Radiculopathy, lumbar region: Secondary | ICD-10-CM

## 2020-10-05 ENCOUNTER — Telehealth: Payer: Self-pay | Admitting: Family Medicine

## 2020-10-05 NOTE — Telephone Encounter (Signed)
Please schedule (around 11/16/2020) for diabetes follow up with  fasting labs prior for Dr. Diona Browner.

## 2020-10-05 NOTE — Telephone Encounter (Signed)
Spoke with patient refused to schedule appointment at this time . Stated she is having her diabetes check at her up coming appointment

## 2020-10-07 NOTE — Telephone Encounter (Signed)
Close encounter 

## 2020-10-12 ENCOUNTER — Other Ambulatory Visit: Payer: Self-pay

## 2020-10-12 ENCOUNTER — Ambulatory Visit
Admission: RE | Admit: 2020-10-12 | Discharge: 2020-10-12 | Disposition: A | Discharge status: Home / Self Care | Payer: Medicare Other | Source: Ambulatory Visit | Attending: Orthopaedic Surgery | Admitting: Orthopaedic Surgery

## 2020-10-12 DIAGNOSIS — M48061 Spinal stenosis, lumbar region without neurogenic claudication: Secondary | ICD-10-CM | POA: Diagnosis not present

## 2020-10-12 DIAGNOSIS — M5416 Radiculopathy, lumbar region: Secondary | ICD-10-CM

## 2020-10-12 DIAGNOSIS — M545 Low back pain, unspecified: Secondary | ICD-10-CM | POA: Diagnosis not present

## 2020-10-13 ENCOUNTER — Other Ambulatory Visit: Payer: Self-pay | Admitting: Family Medicine

## 2020-10-24 DIAGNOSIS — M5416 Radiculopathy, lumbar region: Secondary | ICD-10-CM | POA: Diagnosis not present

## 2020-11-17 DIAGNOSIS — M5416 Radiculopathy, lumbar region: Secondary | ICD-10-CM | POA: Diagnosis not present

## 2020-12-02 DIAGNOSIS — M5416 Radiculopathy, lumbar region: Secondary | ICD-10-CM | POA: Diagnosis not present

## 2020-12-04 ENCOUNTER — Other Ambulatory Visit: Payer: Self-pay | Admitting: Family Medicine

## 2020-12-14 DIAGNOSIS — M5416 Radiculopathy, lumbar region: Secondary | ICD-10-CM | POA: Diagnosis not present

## 2021-01-05 ENCOUNTER — Telehealth: Payer: Self-pay | Admitting: Family Medicine

## 2021-01-05 DIAGNOSIS — J0100 Acute maxillary sinusitis, unspecified: Secondary | ICD-10-CM | POA: Diagnosis not present

## 2021-01-05 DIAGNOSIS — E1159 Type 2 diabetes mellitus with other circulatory complications: Secondary | ICD-10-CM

## 2021-01-05 MED ORDER — FREESTYLE TEST VI STRP: ORAL_STRIP | 3 refills | Status: DC

## 2021-01-05 MED ORDER — FREESTYLE LANCETS MISC: 3 refills | Status: DC

## 2021-01-05 NOTE — Telephone Encounter (Signed)
Refills sent as requested

## 2021-01-05 NOTE — Telephone Encounter (Signed)
Pt called in requesting RX FREESTYLE TEST STRIPS test strip and Lancets (FREESTYLE) lancets be sent [id number] [place] . Stated it has to change by provider [phone]

## 2021-01-10 DIAGNOSIS — M5416 Radiculopathy, lumbar region: Secondary | ICD-10-CM | POA: Diagnosis not present

## 2021-01-20 ENCOUNTER — Other Ambulatory Visit: Payer: Self-pay | Admitting: Family Medicine

## 2021-01-20 NOTE — Telephone Encounter (Signed)
Last office visit 05/17/2020 for [place].  Last refilled 09/12/2020 for #90 with no refills.  CPE scheduled for 05/19/2021.

## 2021-01-31 ENCOUNTER — Other Ambulatory Visit: Payer: Self-pay | Admitting: Family Medicine

## 2021-01-31 DIAGNOSIS — H353131 Nonexudative age-related macular degeneration, bilateral, early dry stage: Secondary | ICD-10-CM | POA: Diagnosis not present

## 2021-02-02 ENCOUNTER — Other Ambulatory Visit: Payer: Self-pay | Admitting: Family Medicine

## 2021-02-28 DIAGNOSIS — Z03818 Encounter for observation for suspected exposure to other biological agents ruled out: Secondary | ICD-10-CM | POA: Diagnosis not present

## 2021-02-28 DIAGNOSIS — Z20822 Contact with and (suspected) exposure to covid-19: Secondary | ICD-10-CM | POA: Diagnosis not present

## 2021-03-01 ENCOUNTER — Telehealth: Payer: Self-pay

## 2021-03-01 NOTE — Telephone Encounter (Signed)
I called patient; pt did not go anywhere to be seen on 02/28/21. Pt said 'she feels terrible like a dog". I told pt I was sorry she was feeling bad and I was going to try to help her to be evaluated and pt said she was on the phone with a nurse last night for 20 mins. Pt said she is not going to [facility] [place] and pt is not going to ED. I advised I could set pt up for virtual visit and pt said no she did not want "the world" in her house and she did not have a way to do a virtual visit and was not going to [employer] to have a virtual visit so everyone would know what is going on with her. Pt said " she is done with you guys; [facility] does not have any concern for their patients." I started to apologize and explain the options for the pt and she said Sorry does not help. Pt said that [facility] is "crappy" and she is done with [facility]. Pt said " [place] has gone down the tubes." Pt said that when I called she was getting ready to go to [facility] and that was her only option." I checked on line and I could schedule pt an appt at [facility] in [place] today at 11:00AM and the wait time listed now is 8 mins. Pt said she does not care that she is not going to have anything to do with [facility] and there are no other GD options. I said there is a [facility] in [place] also and pt said no that [employer] and she is going to [place].  She said she has already had a + PCR test and that should be enough. Again I said I was sorry that she was not feeling well and I hoped she felt better and I would let Dr Diona Browner know that she was going to [facility] for evaluation. Pt said as far as she was concerned that was a day late and a dollar short. Call ended. Sending note to Dr Diona Browner and Butch Penny CMA and will teams Butch Penny also.

## 2021-03-01 NOTE — Telephone Encounter (Signed)
[facility] [facility] Night - Client TELEPHONE ADVICE RECORD [facility] Patient Name: [name] Gender: Female DOB: 05-30-1940 Age: 81 Y 14 D Return Phone Number: [phone] (Primary) Address: City/ State/ Zip: [place]  [address] Client [place] Night - Client Client Site [place] Provider Eliezer Lofts - MD Contact Type Call Who Is Calling Patien?<?????????r Call Type Triage / Clinical Relationship To Patient Self Return Phone Number [phone] (Primary) Chief Complaint Cough Reason for Call Symptomatic / Request for Health Information Initial Comment Caller states she has tested positive for covidsymptoms- coughing, low grade temperature, lots of mucus Antiviral is available at [facility] on [address] Translation No Nurse Assessment Nurse: Valetta Close, RN, Helene Kelp Date/Time Eilene Ghazi Time): 02/28/2021 5:34:59 PM Confirm and document reason for call. If symptomatic, describe symptoms. ---Caller states she has tested positive for covid today. States husband is hospitalized with severe case of covid. Caller is coughing, low grade temperature, lots of mucus. Antiviral is available at [facility] on [facility]. Does the patient have any new or worsening symptoms? ---Yes Will a triage be completed? ---Yes Related visit to physician within the last 2 weeks? ---No Does the PT have any chronic conditions? (i.e. diabetes, asthma, this includes High risk factors for pregnancy, etc.) ---Yes List chronic conditions. ---pre-diabetic / autoimmune liver disease inherited Is this a behavioral health or substance abuse call? ---No Guidelines Guideline Title Affirmed Question Affirmed Notes Nurse Date/Time (Eastern Time) COVID-19 - Diagnosed or Suspected MILD difficulty breathing (e.g., minimal/no SOB at rest, SOB with walking, pulse <100) Valetta Close, RN, Helene Kelp 02/28/2021 5:37:13 PM PLEASE NOTE: All  timestamps contained within this report are represented as [place] Standard Time. CONFIDENTIALTY NOTICE: This fax transmission is intended only for the addressee. It contains information that is legally privileged, confidential or otherwise protected from use or disclosure. If you are not the intended recipient, you are strictly prohibited from reviewing, disclosing, copying using or disseminating any of this information or taking any action in reliance on or regarding this information. If you have received this fax in error, please notify us immediately by telephone so that we can arrange for its return to [place]. Phone: [phone], Toll-Free: [phone], Fax: [phone] Page: 2 of 2 Call Id: [id number] [place]. Time Eilene Ghazi Time) Disposition Final User 02/28/2021 5:54:20 PM Called On-Call Provider Valetta Close, RN, Helene Kelp 02/28/2021 5:53:50 PM See HCP within 4 Hours (or PCP triage) Yes Valetta Close, RN, [place] Artist Disagree/Comply Disagree Caller Understands Yes PreDisposition Call Doctor Care Advice Given Per Guideline SEE HCP (OR PCP TRIAGE) WITHIN 4 HOURS: * IF OFFICE WILL BE CLOSED AND NO PCP (PRIMARY CARE PROVIDER) SECOND-LEVEL TRIAGE: You need to be seen within the next 3 or 4 hours. A nearby Urgent Care Center [facility]) is often a good source of care. Another choice is to go to the ED. Go sooner if you become worse. CALL BACK IF: * You become worse CARE ADVICE given per COVID-19 - DIAGNOSED OR SUSPECTED (Adult) guideline. * IF OFFICE WILL BE CLOSED AND PCP SECOND-LEVEL TRIAGE REQUIRED: You may need to be seen. Your doctor (or NP/PA) will want to talk with you to decide what's best. I'll page the on-call provider now. If you haven't heard from the provider (or me) within 30 minutes, call again. NOTE: If on-call provider can't be reached, send to [facility] or ED. * ED: Patients who may need surgery or hospital admission need to be sent to an ED. So do most patients  with serious symptoms or complex medical  problems. Comments User: Wyline Mood, RN Date/Time Eilene Ghazi Time): 02/28/2021 5:58:27 PM On-call recommendation reviewed with caller - became increasingly irate and verbally abusive. States she is under a severe amount of stress and simply needs an antiviral prescription. States she is calling [facility] because this practice doesn't care about it's patients. Explained and reinforced that she is Covid positive and will probably not be allowed to visit her husband - insists that she is visiting him this evening - would not consider being seen and evaluated in the ER first. Explained not all patients are candidates for antivirals and evaluation including her history is needed. Referrals GO TO FACILITY UNDECIDED Paging DoctorName Phone DateTime Result/ Outcome Message Type Notes Walker Kehr MD [phone] 02/28/2021 5:54:20 PM Called On Call Provider - Reached Doctor Paged Walker Kehr - MD 02/28/2021 5:55:07 PM Spoke with On Call - General Message Result Recommendation: please tell patient that she should go to the ER for evaluation

## 2021-03-02 DIAGNOSIS — L089 Local infection of the skin and subcutaneous tissue, unspecified: Secondary | ICD-10-CM | POA: Diagnosis not present

## 2021-03-02 DIAGNOSIS — T148XXA Other injury of unspecified body region, initial encounter: Secondary | ICD-10-CM | POA: Diagnosis not present

## 2021-03-02 DIAGNOSIS — Z03818 Encounter for observation for suspected exposure to other biological agents ruled out: Secondary | ICD-10-CM | POA: Diagnosis not present

## 2021-03-02 DIAGNOSIS — J019 Acute sinusitis, unspecified: Secondary | ICD-10-CM | POA: Diagnosis not present

## 2021-03-02 DIAGNOSIS — U071 COVID-19: Secondary | ICD-10-CM | POA: Diagnosis not present

## 2021-03-02 NOTE — Telephone Encounter (Signed)
Pt called back and I offered appt, she said: "its a day late and a dollar short, I called on Tuesday because I had a time frame and didn't hear anything so I had to go somewhere else that could get me in." Pt declined appt

## 2021-03-02 NOTE — Telephone Encounter (Signed)
Left message for [name] that Dr. Diona Browner would be happy to do a phone visit with her today if she would like and if she wasn't seen at [facility] yesterday.  I ask that she call back to schedule if interested.

## 2021-03-02 NOTE — Telephone Encounter (Signed)
Call and offer phone visit today to start antivirals if not seen at [facility] (no note seen in EMR)

## 2021-03-02 NOTE — Telephone Encounter (Signed)
Noted  

## 2021-03-08 ENCOUNTER — Telehealth: Payer: Self-pay

## 2021-03-08 NOTE — Telephone Encounter (Signed)
Pts husband has appt with Dr Diona Browner on 03/10/21 and pt said she is nauseated all the time; no vomiting and no diarrhea.pt said she finished the antiviral on 03/06/21. Pt said no fever and no other covid symptoms. Pt wants appt to see if she can feel better. Pt scheduled appt on 03/10/21 at 4pm. UC & ED precautions given and pt voiced understanding. Sending note to Dr Diona Browner and Butch Penny CMA.

## 2021-03-10 ENCOUNTER — Ambulatory Visit (INDEPENDENT_AMBULATORY_CARE_PROVIDER_SITE_OTHER): Payer: Medicare Other | Admitting: Family Medicine

## 2021-03-10 ENCOUNTER — Other Ambulatory Visit: Payer: Self-pay

## 2021-03-10 ENCOUNTER — Encounter: Payer: Self-pay | Admitting: Family Medicine

## 2021-03-10 VITALS — BP 120/70 | HR 82 | Temp 98.5°F | Ht 64.75 in | Wt 167.4 lb

## 2021-03-10 DIAGNOSIS — K2900 Acute gastritis without bleeding: Secondary | ICD-10-CM | POA: Diagnosis not present

## 2021-03-10 DIAGNOSIS — U071 COVID-19: Secondary | ICD-10-CM | POA: Diagnosis not present

## 2021-03-10 NOTE — Progress Notes (Signed)
Patient ID: [name], female    DOB: 08-14-1940, 81 y.o.   MRN: [id number]  This visit was conducted in person.  BP 120/70    Pulse 82    Temp 98.5 F (36.9 C) (Temporal)    Ht 5' 4.75" (1.645 m)    Wt 167 lb 6 oz (75.9 kg)    SpO2 96%    BMI 28.07 kg/m    CC: Chief Complaint  Patient presents with   Post Covid   Nausea   No Appetite   Fatigue    Subjective:   HPI: [name] is a 81 y.o. female presenting on 03/10/2021 for Post Covid, Nausea, No Appetite, and Fatigue  Seen at [facility] 03/02/2021.Marland KitchenMarland KitchenDx with COVID Treated with oral antiviral. Started with congestion, cough.. now improved.   Was using ibuprofen... caused stomach upset.  Using tylenol.    Now with decreased appetite, nausea.  Fatigue. No heartburn.. burning in upper stomach.  Using pepcid AC 3 times a day.  Trying to eat less acidic foods. She is drinking a lot of water.  Wt Readings from Last 3 Encounters:  03/10/21 167 lb 6 oz (75.9 kg)  09/19/20 178 lb (80.7 kg)  05/17/20 172 lb 8 oz (78.2 kg)   Body mass index is 28.07 kg/m.        Relevant past medical, surgical, family and social history reviewed and updated as indicated. Interim medical history since our last visit reviewed. Allergies and medications reviewed and updated. Outpatient Medications Prior to Visit  Medication Sig Dispense Refill   acetaminophen (TYLENOL) 325 MG tablet Take 650 mg by mouth every 4 (four) hours as needed.     ALPRAZolam (XANAX) 0.25 MG tablet TAKE ONE TABLET BY MOUTH ONE TIME DAILY AS NEEDED 90 tablet 0   cetirizine (ZYRTEC) 10 MG tablet Take 10 mg by mouth 2 (two) times daily.     glucose blood (FREESTYLE TEST STRIPS) test strip Use to check blood sugar one time daily 100 strip 3   hydrochlorothiazide (HYDRODIURIL) 25 MG tablet Take 25 mg by mouth daily.     ibuprofen (ADVIL) 200 MG tablet Take 200 mg by mouth every 6 (six) hours as needed.     Lancets (FREESTYLE) lancets Use to check blood sugar  once daily 100 each 3   lisinopril (PRINIVIL,ZESTRIL) 10 MG tablet Take 10 mg by mouth 2 (two) times daily.      metFORMIN (GLUCOPHAGE) 500 MG tablet TAKE ONE TABLET BY MOUTH TWICE A DAY WITH FOOD 180 tablet 0   ursodiol (ACTIGALL) 300 MG capsule Take 2 capsules by mouth in the morning and 1 capsule by mouth in the evening     No facility-administered medications prior to visit.     Per HPI unless specifically indicated in ROS section below Review of Systems  Constitutional:  Negative for fatigue and fever.  HENT:  Negative for ear pain.   Eyes:  Negative for pain.  Respiratory:  Negative for chest tightness and shortness of breath.   Cardiovascular:  Negative for chest pain, palpitations and leg swelling.  Gastrointestinal:  Positive for abdominal pain and nausea.  Genitourinary:  Negative for dysuria.  Objective:  BP 120/70    Pulse 82    Temp 98.5 F (36.9 C) (Temporal)    Ht 5' 4.75" (1.645 m)    Wt 167 lb 6 oz (75.9 kg)    SpO2 96%    BMI 28.07 kg/m   Wt Readings from  Last 3 Encounters:  03/10/21 167 lb 6 oz (75.9 kg)  09/19/20 178 lb (80.7 kg)  05/17/20 172 lb 8 oz (78.2 kg)      Physical Exam    Results for orders placed or performed in visit on 07/15/20  HM DIABETES EYE EXAM  Result Value Ref Range   HM Diabetic Eye Exam No Retinopathy No Retinopathy    This visit occurred during the SARS-CoV-2 public health emergency.  Safety protocols were in place, including screening questions prior to the visit, additional usage of staff PPE, and extensive cleaning of exam room while observing appropriate contact time as indicated for disinfecting solutions.   COVID 19 screen:  No recent travel or known exposure to COVID19 The patient denies respiratory symptoms of COVID 19 at this time. The importance of social distancing was discussed today.   Assessment and Plan    Problem List Items Addressed This Visit   None Visit Diagnoses     COVID-19 virus infection    -  Primary    Acute gastritis without hemorrhage, unspecified gastritis type          Improving COVID infection.. rest, fluids and time for recovery.  Abd pain and nausea likely due to gastritis from stress, NSAIDS, etc. She does not want to use PPI as recommended. She will continue H2 Blocker (Pepcid AC). She will decreased acididc triggers and will remain off ibuprofen. Offered antiemetic.. she is not interested.  Eliezer Lofts, MD

## 2021-03-10 NOTE — Patient Instructions (Signed)
Continue Pepcid AC twice daily.  Eat bland foods, avoid acidic foods. Keep up with water.  Avoid ibuprofen.

## 2021-04-03 ENCOUNTER — Other Ambulatory Visit: Payer: Self-pay | Admitting: Family Medicine

## 2021-04-26 DIAGNOSIS — M25562 Pain in left knee: Secondary | ICD-10-CM | POA: Diagnosis not present

## 2021-05-01 ENCOUNTER — Other Ambulatory Visit: Payer: Self-pay | Admitting: Family Medicine

## 2021-05-01 DIAGNOSIS — H16223 Keratoconjunctivitis sicca, not specified as Sjogren's, bilateral: Secondary | ICD-10-CM | POA: Diagnosis not present

## 2021-05-01 LAB — HM DIABETES FOOT EXAM

## 2021-05-01 NOTE — Telephone Encounter (Signed)
Last office visit 03/10/2021 for Covid-19 and Acute Gastritis.  Last refilled 01/20/2021 for #90 with no refills.  CPE scheduled 05/19/2021. ?

## 2021-05-03 ENCOUNTER — Telehealth: Payer: Self-pay | Admitting: Family Medicine

## 2021-05-03 DIAGNOSIS — E1159 Type 2 diabetes mellitus with other circulatory complications: Secondary | ICD-10-CM

## 2021-05-03 NOTE — Telephone Encounter (Signed)
-----   Message from Velna Hatchet, RT sent at 04/25/2021  4:21 PM EDT ----- ?Regarding: Lab orders for appt on Friday, 05/12/21 ?Patient is scheduled for cpx, please order future labs.  Thanks, Anda Kraft  ? ?

## 2021-05-09 DIAGNOSIS — L738 Other specified follicular disorders: Secondary | ICD-10-CM | POA: Diagnosis not present

## 2021-05-09 DIAGNOSIS — C44311 Basal cell carcinoma of skin of nose: Secondary | ICD-10-CM | POA: Diagnosis not present

## 2021-05-09 DIAGNOSIS — L821 Other seborrheic keratosis: Secondary | ICD-10-CM | POA: Diagnosis not present

## 2021-05-09 DIAGNOSIS — L570 Actinic keratosis: Secondary | ICD-10-CM | POA: Diagnosis not present

## 2021-05-09 DIAGNOSIS — L720 Epidermal cyst: Secondary | ICD-10-CM | POA: Diagnosis not present

## 2021-05-09 DIAGNOSIS — D485 Neoplasm of uncertain behavior of skin: Secondary | ICD-10-CM | POA: Diagnosis not present

## 2021-05-10 DIAGNOSIS — E7800 Pure hypercholesterolemia, unspecified: Secondary | ICD-10-CM | POA: Diagnosis not present

## 2021-05-10 DIAGNOSIS — I10 Essential (primary) hypertension: Secondary | ICD-10-CM | POA: Diagnosis not present

## 2021-05-12 ENCOUNTER — Other Ambulatory Visit (INDEPENDENT_AMBULATORY_CARE_PROVIDER_SITE_OTHER): Payer: Medicare Other

## 2021-05-12 DIAGNOSIS — E1159 Type 2 diabetes mellitus with other circulatory complications: Secondary | ICD-10-CM | POA: Diagnosis not present

## 2021-05-12 LAB — LIPID PANEL
Cholesterol: 178 mg/dL (ref 0–200)
HDL: 59.4 mg/dL
LDL Cholesterol: 99 mg/dL (ref 0–99)
NonHDL: 118.5
Total CHOL/HDL Ratio: 3
Triglycerides: 99 mg/dL (ref 0.0–149.0)
VLDL: 19.8 mg/dL (ref 0.0–40.0)

## 2021-05-12 LAB — COMPREHENSIVE METABOLIC PANEL
ALT: 11 U/L (ref 0–35)
AST: 20 U/L (ref 0–37)
Albumin: 4.4 g/dL (ref 3.5–5.2)
Alkaline Phosphatase: 66 U/L (ref 39–117)
BUN: 39 mg/dL — ABNORMAL HIGH (ref 6–23)
CO2: 23 mEq/L (ref 19–32)
Calcium: 9.8 mg/dL (ref 8.4–10.5)
Chloride: 104 mEq/L (ref 96–112)
Creatinine, Ser: 1.1 mg/dL (ref 0.40–1.20)
GFR: 47.55 mL/min — ABNORMAL LOW (ref >60.00)
Glucose, Bld: 87 mg/dL (ref 70–99)
Potassium: 4.1 mEq/L (ref 3.5–5.1)
Sodium: 137 mEq/L (ref 135–145)
Total Bilirubin: 0.5 mg/dL (ref 0.2–1.2)
Total Protein: 7.5 g/dL (ref 6.0–8.3)

## 2021-05-12 LAB — HEMOGLOBIN A1C: Hgb A1c MFr Bld: 6.4 % (ref 4.6–6.5)

## 2021-05-12 NOTE — Progress Notes (Signed)
No critical labs need to be addressed urgently. We will discuss labs in detail at upcoming office visit.   

## 2021-05-19 ENCOUNTER — Ambulatory Visit (INDEPENDENT_AMBULATORY_CARE_PROVIDER_SITE_OTHER): Payer: Medicare Other | Admitting: Family Medicine

## 2021-05-19 ENCOUNTER — Encounter: Payer: Self-pay | Admitting: Family Medicine

## 2021-05-19 ENCOUNTER — Other Ambulatory Visit: Payer: Medicare Other

## 2021-05-19 VITALS — BP 132/70 | HR 68 | Temp 97.8°F | Ht 65.0 in | Wt 171.8 lb

## 2021-05-19 DIAGNOSIS — E785 Hyperlipidemia, unspecified: Secondary | ICD-10-CM | POA: Diagnosis not present

## 2021-05-19 DIAGNOSIS — E1169 Type 2 diabetes mellitus with other specified complication: Secondary | ICD-10-CM | POA: Diagnosis not present

## 2021-05-19 DIAGNOSIS — R5383 Other fatigue: Secondary | ICD-10-CM | POA: Diagnosis not present

## 2021-05-19 DIAGNOSIS — Z5309 Procedure and treatment not carried out because of other contraindication: Secondary | ICD-10-CM

## 2021-05-19 DIAGNOSIS — K743 Primary biliary cirrhosis: Secondary | ICD-10-CM | POA: Diagnosis not present

## 2021-05-19 DIAGNOSIS — R251 Tremor, unspecified: Secondary | ICD-10-CM

## 2021-05-19 DIAGNOSIS — D649 Anemia, unspecified: Secondary | ICD-10-CM

## 2021-05-19 DIAGNOSIS — F411 Generalized anxiety disorder: Secondary | ICD-10-CM | POA: Diagnosis not present

## 2021-05-19 DIAGNOSIS — I152 Hypertension secondary to endocrine disorders: Secondary | ICD-10-CM | POA: Diagnosis not present

## 2021-05-19 DIAGNOSIS — M48061 Spinal stenosis, lumbar region without neurogenic claudication: Secondary | ICD-10-CM | POA: Diagnosis not present

## 2021-05-19 DIAGNOSIS — Z0000 Encounter for general adult medical examination without abnormal findings: Secondary | ICD-10-CM

## 2021-05-19 DIAGNOSIS — E1159 Type 2 diabetes mellitus with other circulatory complications: Secondary | ICD-10-CM

## 2021-05-19 DIAGNOSIS — M810 Age-related osteoporosis without current pathological fracture: Secondary | ICD-10-CM

## 2021-05-19 DIAGNOSIS — M4800 Spinal stenosis, site unspecified: Secondary | ICD-10-CM | POA: Insufficient documentation

## 2021-05-19 LAB — CBC WITH DIFFERENTIAL/PLATELET
Basophils Absolute: 0.1 10*3/uL (ref 0.0–0.1)
Basophils Relative: 1.1 % (ref 0.0–3.0)
Eosinophils Absolute: 0.1 10*3/uL (ref 0.0–0.7)
Eosinophils Relative: 1.7 % (ref 0.0–5.0)
HCT: 32.2 % — ABNORMAL LOW (ref 36.0–46.0)
Hemoglobin: 10.7 g/dL — ABNORMAL LOW (ref 12.0–15.0)
Lymphocytes Relative: 23.5 % (ref 12.0–46.0)
Lymphs Abs: 1.6 10*3/uL (ref 0.7–4.0)
MCHC: 33.1 g/dL (ref 30.0–36.0)
MCV: 88.9 fl (ref 78.0–100.0)
Monocytes Absolute: 0.5 10*3/uL (ref 0.1–1.0)
Monocytes Relative: 8.1 % (ref 3.0–12.0)
Neutro Abs: 4.3 10*3/uL (ref 1.4–7.7)
Neutrophils Relative %: 65.6 % (ref 43.0–77.0)
Platelets: 373 10*3/uL (ref 150.0–400.0)
RBC: 3.62 Mil/uL — ABNORMAL LOW (ref 3.87–5.11)
RDW: 13.5 % (ref 11.5–15.5)
WBC: 6.6 10*3/uL (ref 4.0–10.5)

## 2021-05-19 LAB — VITAMIN B12: Vitamin B-12: 182 pg/mL — ABNORMAL LOW (ref 211–911)

## 2021-05-19 LAB — VITAMIN D 25 HYDROXY (VIT D DEFICIENCY, FRACTURES): VITD: 16.69 ng/mL — ABNORMAL LOW (ref 30.00–100.00)

## 2021-05-19 LAB — TSH: TSH: 2.88 u[IU]/mL (ref 0.35–5.50)

## 2021-05-19 NOTE — Assessment & Plan Note (Signed)
Per hepatologist unable to take statin ?

## 2021-05-19 NOTE — Assessment & Plan Note (Signed)
Chronic, stable control ? ?Using alprazolam low-dose 0.25 mg daily.  She is unable to wean off this.  She refuses SSRI treatment as she has had side effects to Prozac in the past ?

## 2021-05-19 NOTE — Assessment & Plan Note (Signed)
Chronic, stable ? ?Followed by Dr. Loletha Grayer at [facility].  On Actigall 302 capsules by mouth in the morning and 1 capsule at night. ?

## 2021-05-19 NOTE — Assessment & Plan Note (Signed)
Stable, chronic.  Continue current medication. ? ? ?lisinopril 10 mg daily ?Hydrochlorothiazide 25 mg daily ?

## 2021-05-19 NOTE — Patient Instructions (Addendum)
Please stop at the lab to have labs drawn. ? Can increase Nasocort to  1 spray twice daily. ?Work on keeping up with water intake. ? ?

## 2021-05-19 NOTE — Progress Notes (Signed)
Hands shake as well sometimes . TMJ???? ? ?

## 2021-05-19 NOTE — Progress Notes (Signed)
? ? Patient ID: [name], female    DOB: January 05, 1941, 81 y.o.   MRN: [id number] ? ?This visit was conducted in person. ? ?BP 132/70 (BP Location: Left Arm, Patient Position: Sitting, Cuff Size: Large)   Pulse 68   Temp 97.8 ?F (36.6 ?C) (Oral)   Ht '5\' 5"'$  (1.651 m)   Wt 171 lb 12.8 oz (77.9 kg)   SpO2 99%   BMI 28.59 kg/m?   ? ?CC:  ?Chief Complaint  ?Patient presents with  ? Medicare Wellness  ? ? ?Subjective:  ? ?HPI: ?[name] is a 81 y.o. female presenting on 05/19/2021 for Medicare Wellness ? ?The patient presents for annual medicare wellness, complete physical and review of chronic health problems. He/She also has the following acute concerns today:  ?  She has noted tremor in hands and head... onoging for years, getting slightly worse. ? No known family history of parkinson's  or benign tremor. ? ?I have personally reviewed the Medicare Annual Wellness questionnaire and have noted ?1. The patient's medical and social history ?2. Their use of alcohol, tobacco or illicit drugs ?3. Their current medications and supplements ?4. The patient's functional ability including ADL's, fall risks, home safety risks and hearing or visual ?            impairment. ?5. Diet and physical activities ?6. Evidence for depression or mood disorders ?7.         Updated provider list ?Cognitive evaluation was performed and recorded on pt medicare questionnaire form. ?The patients weight, height, BMI and visual acuity have been recorded in the chart   ?I have made referrals, counseling and provided education to the patient based review of the above and I have provided the pt with a written personalized care plan for preventive services.  ? Documentation of this information was scanned into the electronic record under the media tab. ? ? Advance directives and end of life planning reviewed in detail with patient and documented in EMR. Patient given handout on advance care directives if needed. HCPOA and living will updated  if needed. ? ?No falls in last 12 months. ? ?Personnel officer Visit from 05/19/2021 in [place] at [place]  ?PHQ-2 Total Score 0  ? ?  ? ?Screening hearing and vision performed and entered  in epic. ?   ?Diabetes: At goal on metformin 500 mg twice daily ?Lab Results  ?Component Value Date  ? HGBA1C 6.4 05/12/2021  ?Using medications without difficulties: ?Hypoglycemic episodes: ?Hyperglycemic episodes: ?Feet problems: none ?Blood Sugars averaging: ?eye exam within last year: none ? ?Hypertension:  Good control on HCTZ and lisinopril ?BP Readings from Last 3 Encounters:  ?05/19/21 132/70  ?03/10/21 120/70  ?09/19/20 (!) 152/60  ?Using medication without problems or lightheadedness: none ?Chest pain with exertion: none ?Edema:  occ ?Short of breath: none ?Average home BPs: ?Other issues: ? ?Elevated Cholesterol: LDL at goal less than 100, statin contraindicated given PBC ?Lab Results  ?Component Value Date  ? CHOL 178 05/12/2021  ? HDL 59.40 05/12/2021  ? Leon 99 05/12/2021  ? TRIG 99.0 05/12/2021  ? CHOLHDL 3 05/12/2021  ?Using medications without problems: ?Muscle aches:  ?Diet compliance: moderate ?Exercise: walks as able ?Other complaints: ? ? 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  Followed by Dr. Loletha Grayer at [facility], followed  yearly. ? ?GAD: Stable control on alprazolam. She is using 0.25 mg daily..  Usually uses daily.  She is not able to decrease the dose but  also not escalating use. ?She refuses trial of SSRI as she has had side effects to Prozac in the past. ?  ? Reviewed OV from [facility] cardiology 05/10/2021 ? ? Spinal stenosis treated by Dr. Mina Marble with ESI. Tolerable control. Uses tens unit with relief. ?Tylenol for pain. ? ? ?Relevant past medical, surgical, family and social history reviewed and updated as indicated. Interim medical history since our last visit reviewed. ?Allergies and medications reviewed and updated. ?Outpatient Medications Prior to Visit  ?Medication Sig Dispense  Refill  ? acetaminophen (TYLENOL) 325 MG tablet Take 650 mg by mouth every 4 (four) hours as needed.    ? ALPRAZolam (XANAX) 0.25 MG tablet TAKE ONE TABLET BY MOUTH ONE TIME DAILY AS NEEDED 90 tablet 0  ? cetirizine (ZYRTEC) 10 MG tablet Take 10 mg by mouth 2 (two) times daily.    ? glucose blood (FREESTYLE TEST STRIPS) test strip Use to check blood sugar one time daily 100 strip 3  ? hydrochlorothiazide (HYDRODIURIL) 25 MG tablet Take 25 mg by mouth daily.    ? ibuprofen (ADVIL) 200 MG tablet Take 200 mg by mouth every 6 (six) hours as needed.    ? Lancets (FREESTYLE) lancets Use to check blood sugar once daily 100 each 3  ? lisinopril (PRINIVIL,ZESTRIL) 10 MG tablet Take 10 mg by mouth 2 (two) times daily.     ? metFORMIN (GLUCOPHAGE) 500 MG tablet TAKE ONE TABLET BY MOUTH TWICE A DAY WITH FOOD 180 tablet 0  ? ursodiol (ACTIGALL) 300 MG capsule Take 2 capsules by mouth in the morning and 1 capsule by mouth in the evening    ? ?No facility-administered medications prior to visit.  ?  ? ?Per HPI unless specifically indicated in ROS section below ?Review of Systems  ?Constitutional:  Positive for fatigue. Negative for fever.  ?HENT:  Negative for ear pain.   ?Eyes:  Negative for pain.  ?Respiratory:  Negative for chest tightness and shortness of breath.   ?Cardiovascular:  Negative for chest pain, palpitations and leg swelling.  ?Gastrointestinal:  Negative for abdominal pain.  ?Genitourinary:  Negative for dysuria.  ?Musculoskeletal:  Positive for back pain.  ?Objective:  ?BP 132/70 (BP Location: Left Arm, Patient Position: Sitting, Cuff Size: Large)   Pulse 68   Temp 97.8 ?F (36.6 ?C) (Oral)   Ht '5\' 5"'$  (1.651 m)   Wt 171 lb 12.8 oz (77.9 kg)   SpO2 99%   BMI 28.59 kg/m?   ?Wt Readings from Last 3 Encounters:  ?05/19/21 171 lb 12.8 oz (77.9 kg)  ?03/10/21 167 lb 6 oz (75.9 kg)  ?09/19/20 178 lb (80.7 kg)  ?  ?  ?Physical Exam ?Constitutional:   ?   General: She is not in acute distress. ?   Appearance: Normal  appearance. She is well-developed. She is not ill-appearing or toxic-appearing.  ?HENT:  ?   Head: Normocephalic.  ?   Right Ear: Hearing, tympanic membrane, ear canal and external ear normal. Tympanic membrane is not erythematous, retracted or bulging.  ?   Left Ear: Hearing, tympanic membrane, ear canal and external ear normal. Tympanic membrane is not erythematous, retracted or bulging.  ?   Nose: No mucosal edema or rhinorrhea.  ?   Right Sinus: No maxillary sinus tenderness or frontal sinus tenderness.  ?   Left Sinus: No maxillary sinus tenderness or frontal sinus tenderness.  ?   Mouth/Throat:  ?   Pharynx: Uvula midline.  ?Eyes:  ?   General:  Lids are normal. Lids are everted, no foreign bodies appreciated.  ?   Conjunctiva/sclera: Conjunctivae normal.  ?   Pupils: Pupils are equal, round, and reactive to light.  ?Neck:  ?   Thyroid: No thyroid mass or thyromegaly.  ?   Vascular: No carotid bruit.  ?   Trachea: Trachea normal.  ?Cardiovascular:  ?   Rate and Rhythm: Normal rate and regular rhythm.  ?   Pulses: Normal pulses.  ?   Heart sounds: Normal heart sounds, S1 normal and S2 normal. No murmur heard. ?  No friction rub. No gallop.  ?Pulmonary:  ?   Effort: Pulmonary effort is normal. No tachypnea or respiratory distress.  ?   Breath sounds: Normal breath sounds. No decreased breath sounds, wheezing, rhonchi or rales.  ?Abdominal:  ?   General: Bowel sounds are normal.  ?   Palpations: Abdomen is soft.  ?   Tenderness: There is no abdominal tenderness.  ?Musculoskeletal:  ?   Cervical back: Normal range of motion and neck supple.  ?Skin: ?   General: Skin is warm and dry.  ?   Findings: No rash.  ?Neurological:  ?   Mental Status: She is alert.  ?   Cranial Nerves: Cranial nerves 2-12 are intact.  ?   Sensory: Sensation is intact.  ?   Motor: Tremor present.  ?   Comments: No cogwheeling, normal gait with normal arm swing.  No shuffling.  ?Psychiatric:     ?   Mood and Affect: Mood is not anxious or  depressed.     ?   Speech: Speech normal.     ?   Behavior: Behavior normal. Behavior is cooperative.     ?   Thought Content: Thought content normal.     ?   Judgment: Judgment normal.  ? ?   ? ?Diabetic foot exam: ?N

## 2021-05-19 NOTE — Assessment & Plan Note (Signed)
Chronic, worsening over time ? ?No clear suggestion of Parkinson's disease.  No family history.  Neurologic exam otherwise normal.  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 She is not interested in medication to treat or referral to neurology at this time. ?

## 2021-05-19 NOTE — Assessment & Plan Note (Signed)
Chronic, stable control ? ?Would by Dr. Jillene Bucks.  Treating with epidural steroid injections as needed. ?

## 2021-05-19 NOTE — Assessment & Plan Note (Signed)
Chronic, stable control ? ?Metformin 500 mg p.o. twice daily ?Encouraged exercise, weight loss, healthy eating habits. ? ?

## 2021-05-19 NOTE — Assessment & Plan Note (Signed)
Chronic, stable control ? ?She has increased risk of cardiovascular disease but refuses statin given her PBC. ?

## 2021-05-20 LAB — IRON,TIBC AND FERRITIN PANEL
%SAT: 21 % (calc) (ref 16–45)
Ferritin: 117 ng/mL (ref 16–288)
Iron: 74 ug/dL (ref 45–160)
TIBC: 349 mcg/dL (calc) (ref 250–450)

## 2021-05-22 DIAGNOSIS — H16223 Keratoconjunctivitis sicca, not specified as Sjogren's, bilateral: Secondary | ICD-10-CM | POA: Diagnosis not present

## 2021-06-02 ENCOUNTER — Other Ambulatory Visit: Payer: Self-pay | Admitting: Family Medicine

## 2021-06-19 DIAGNOSIS — Z85828 Personal history of other malignant neoplasm of skin: Secondary | ICD-10-CM | POA: Diagnosis not present

## 2021-06-19 DIAGNOSIS — C44311 Basal cell carcinoma of skin of nose: Secondary | ICD-10-CM | POA: Diagnosis not present

## 2021-07-03 DIAGNOSIS — L570 Actinic keratosis: Secondary | ICD-10-CM | POA: Diagnosis not present

## 2021-07-05 DIAGNOSIS — K743 Primary biliary cirrhosis: Secondary | ICD-10-CM | POA: Diagnosis not present

## 2021-07-16 ENCOUNTER — Other Ambulatory Visit: Payer: Self-pay | Admitting: Family Medicine

## 2021-07-16 NOTE — Telephone Encounter (Signed)
Last office visit 05/19/21 for [place].  Last refilled 05/01/21 for #90 with no refills.  CPE scheduled for 05/22/22.

## 2021-07-30 ENCOUNTER — Other Ambulatory Visit: Payer: Self-pay | Admitting: Family Medicine

## 2021-08-02 ENCOUNTER — Encounter: Payer: Self-pay | Admitting: Internal Medicine

## 2021-08-02 ENCOUNTER — Ambulatory Visit (INDEPENDENT_AMBULATORY_CARE_PROVIDER_SITE_OTHER): Payer: Medicare Other | Admitting: Internal Medicine

## 2021-08-02 VITALS — BP 116/66 | HR 51 | Temp 97.8°F | Ht 65.0 in | Wt 174.0 lb

## 2021-08-02 DIAGNOSIS — M26621 Arthralgia of right temporomandibular joint: Secondary | ICD-10-CM

## 2021-08-02 DIAGNOSIS — M26629 Arthralgia of temporomandibular joint, unspecified side: Secondary | ICD-10-CM | POA: Insufficient documentation

## 2021-08-02 NOTE — Progress Notes (Signed)
Subjective:    Patient ID: [name], female    DOB: [birth year], 81 y.o.   MRN: [id number]  HPI Here due to jaw problems  Started with worsening right TMJ last October or earlier Dentist told her teeth are fine Got teeth guard in the past--actually made it worse wearing it at night Getting worse and worse Hard to even brush her teeth due to pain Can't open enough to eat sandwich She can hear the click with moving jaw  Ibuprofen '400mg'$   in AM will help---and may repeat at night Tylenol helps but much less  Tried to go to ENT--no appts available  Current Outpatient Medications on File Prior to Visit  Medication Sig Dispense Refill   acetaminophen (TYLENOL) 325 MG tablet Take 650 mg by mouth every 4 (four) hours as needed.     ALPRAZolam (XANAX) 0.25 MG tablet TAKE ONE TABLET BY MOUTH ONE TIME DAILY AS NEEDED 90 tablet 0   Cyanocobalamin (VITAMIN B-12) 2500 MCG SUBL Place under the tongue.     glucose blood (FREESTYLE TEST STRIPS) test strip Use to check blood sugar one time daily 100 strip 3   hydrochlorothiazide (HYDRODIURIL) 25 MG tablet Take 25 mg by mouth daily.     ibuprofen (ADVIL) 200 MG tablet Take 200 mg by mouth every 6 (six) hours as needed.     Lancets (FREESTYLE) lancets Use to check blood sugar once daily 100 each 3   lisinopril (PRINIVIL,ZESTRIL) 10 MG tablet Take 10 mg by mouth 2 (two) times daily.      metFORMIN (GLUCOPHAGE) 500 MG tablet TAKE ONE TABLET BY MOUTH TWICE A DAY WITH FOOD 180 tablet 1   ursodiol (ACTIGALL) 300 MG capsule Take 2 capsules by mouth in the morning and 1 capsule by mouth in the evening     No current facility-administered medications on file prior to visit.    Allergies  Allergen Reactions   Codeine Anaphylaxis    Bad respiratory problems   Demerol [Meperidine] Anaphylaxis    She thinks it has codeine in it    Talwin [Pentazocine] Anaphylaxis    Affects liver and respiratory problems   Tessalon [Benzonatate] Anaphylaxis     Trachea will get so inflammed   Vicodin [Hydrocodone-Acetaminophen] Anaphylaxis    Major respiratory   Betadine [Povidone Iodine]     Skin sloughes off   Tape    Calcium Channel Blockers Rash    Double vision and rash; cant even take CA2+ pills   Iodine Rash    Rash and respiratory   Neosporin [Neomycin-Bacitracin Zn-Polymyx] Rash   Sulfa Antibiotics Rash    Past Medical History:  Diagnosis Date   Diabetes mellitus ([place])    Environmental allergies    Hypertension    Liver failure ([place])    Pancreatitis     Past Surgical History:  Procedure Laterality Date   CARPAL TUNNEL RELEASE  8/91   CHOLECYSTECTOMY  10/80   COLONOSCOPY     ERCP  3/95   fusion on fingers  2000 2001   INCONTINENCE SURGERY  09/17/00   KNEE ARTHROPLASTY  10/25/95   x2   KNEE ARTHROSCOPY  7/92, 93, 94   x3   LIVER BIOPSY  5/95   perforated colon  3/07   SHOULDER ARTHROSCOPY  3/92   spur cut muscle   sigmoid colon surgery  07/17/05    Family History  Problem Relation Age of Onset   Colon cancer Neg Hx    Colon polyps  Neg Hx     Social History   Socioeconomic History   Marital status: Married    Spouse name: Addylynn Balin   Number of children: 2   Years of education: Not on file   Highest education level: Not on file  Occupational History   Not on file  Tobacco Use   Smoking status: Never    Passive exposure: Past (as a child)   Smokeless tobacco: Never  Substance and Sexual Activity   Alcohol use: Yes    Alcohol/week: 0.0 standard drinks of alcohol    Comment: 4oz wine 2 times yearly   Drug use: No   Sexual activity: Not on file  Other Topics Concern   Not on file  Social History Narrative   Right Handed    Lives in a one story home   Social Determinants of Health   Financial Resource Strain: Not on file  Food Insecurity: Not on file  Transportation Needs: Not on file  Physical Activity: Not on file  Stress: Not on file  Social Connections: Not on file  Intimate Partner  Violence: Not on file   Review of Systems Mouth dry at times--but not always No bad headaches No loss of vision    Objective:   Physical Exam Constitutional:      Appearance: Normal appearance.  HENT:     Head:     Comments: Clicking at right > left TMJs Some trismus    Right Ear: Tympanic membrane and ear canal normal.     Left Ear: Tympanic membrane and ear canal normal.  Neck:     Comments: No salivary gland enlargement Musculoskeletal:     Cervical back: Neck supple.  Lymphadenopathy:     Cervical: No cervical adenopathy.  Neurological:     Mental Status: She is alert.            Assessment & Plan:

## 2021-08-02 NOTE — Assessment & Plan Note (Addendum)
Severe with early trismus and trouble chewing at all Nothing to suggest temporal arteritis Ibuprofen helps some--but needs to limit due to CKD Didn't tolerate mouth guard  Discussed trying less burdensome sports mouth guard Refer to oral surgeon

## 2021-08-02 NOTE — Patient Instructions (Signed)
Please get a sports mouth guard at a sports store. I have referred you to an oral surgeon.

## 2021-08-29 DIAGNOSIS — H353131 Nonexudative age-related macular degeneration, bilateral, early dry stage: Secondary | ICD-10-CM | POA: Diagnosis not present

## 2021-08-29 DIAGNOSIS — E119 Type 2 diabetes mellitus without complications: Secondary | ICD-10-CM | POA: Diagnosis not present

## 2021-08-29 DIAGNOSIS — Z961 Presence of intraocular lens: Secondary | ICD-10-CM | POA: Diagnosis not present

## 2021-08-29 LAB — HM DIABETES EYE EXAM

## 2021-09-18 ENCOUNTER — Encounter: Payer: Self-pay | Admitting: Family Medicine

## 2021-09-23 ENCOUNTER — Telehealth: Payer: Self-pay | Admitting: Family Medicine

## 2021-09-23 DIAGNOSIS — E559 Vitamin D deficiency, unspecified: Secondary | ICD-10-CM

## 2021-09-23 DIAGNOSIS — E538 Deficiency of other specified B group vitamins: Secondary | ICD-10-CM

## 2021-09-23 DIAGNOSIS — E1159 Type 2 diabetes mellitus with other circulatory complications: Secondary | ICD-10-CM

## 2021-09-23 NOTE — Telephone Encounter (Signed)
-----   Message from Ellamae Sia sent at 09/20/2021  3:31 PM EDT ----- Regarding: Lab orders for Friday, 9.8.23 Lab orders no f/u appt

## 2021-10-06 ENCOUNTER — Other Ambulatory Visit (INDEPENDENT_AMBULATORY_CARE_PROVIDER_SITE_OTHER): Payer: Medicare Other

## 2021-10-06 ENCOUNTER — Telehealth: Payer: Self-pay | Admitting: Family Medicine

## 2021-10-06 DIAGNOSIS — E538 Deficiency of other specified B group vitamins: Secondary | ICD-10-CM

## 2021-10-06 DIAGNOSIS — Z862 Personal history of diseases of the blood and blood-forming organs and certain disorders involving the immune mechanism: Secondary | ICD-10-CM | POA: Diagnosis not present

## 2021-10-06 DIAGNOSIS — E559 Vitamin D deficiency, unspecified: Secondary | ICD-10-CM

## 2021-10-06 DIAGNOSIS — E1159 Type 2 diabetes mellitus with other circulatory complications: Secondary | ICD-10-CM

## 2021-10-06 LAB — CBC WITH DIFFERENTIAL/PLATELET
Basophils Absolute: 0 10*3/uL (ref 0.0–0.1)
Basophils Relative: 0.2 % (ref 0.0–3.0)
Eosinophils Absolute: 0.4 10*3/uL (ref 0.0–0.7)
Eosinophils Relative: 8.5 % — ABNORMAL HIGH (ref 0.0–5.0)
HCT: 39.2 % (ref 36.0–46.0)
Hemoglobin: 12.6 g/dL (ref 12.0–15.0)
Lymphocytes Relative: 32.8 % (ref 12.0–46.0)
Lymphs Abs: 1.6 10*3/uL (ref 0.7–4.0)
MCHC: 32.2 g/dL (ref 30.0–36.0)
MCV: 88.8 fl (ref 78.0–100.0)
Monocytes Absolute: 0.4 10*3/uL (ref 0.1–1.0)
Monocytes Relative: 8.5 % (ref 3.0–12.0)
Neutro Abs: 2.4 10*3/uL (ref 1.4–7.7)
Neutrophils Relative %: 50 % (ref 43.0–77.0)
Platelets: 328 10*3/uL (ref 150.0–400.0)
RBC: 4.42 Mil/uL (ref 3.87–5.11)
RDW: 15.6 % — ABNORMAL HIGH (ref 11.5–15.5)
WBC: 4.9 10*3/uL (ref 4.0–10.5)

## 2021-10-06 LAB — COMPREHENSIVE METABOLIC PANEL
ALT: 12 U/L (ref 0–35)
AST: 19 U/L (ref 0–37)
Albumin: 4.3 g/dL (ref 3.5–5.2)
Alkaline Phosphatase: 56 U/L (ref 39–117)
BUN: 39 mg/dL — ABNORMAL HIGH (ref 6–23)
CO2: 23 mEq/L (ref 19–32)
Calcium: 9.6 mg/dL (ref 8.4–10.5)
Chloride: 103 mEq/L (ref 96–112)
Creatinine, Ser: 1.44 mg/dL — ABNORMAL HIGH (ref 0.40–1.20)
GFR: 34.32 mL/min — ABNORMAL LOW (ref >60.00)
Glucose, Bld: 123 mg/dL — ABNORMAL HIGH (ref 70–99)
Potassium: 4.2 mEq/L (ref 3.5–5.1)
Sodium: 137 mEq/L (ref 135–145)
Total Bilirubin: 0.5 mg/dL (ref 0.2–1.2)
Total Protein: 7.3 g/dL (ref 6.0–8.3)

## 2021-10-06 LAB — VITAMIN D 25 HYDROXY (VIT D DEFICIENCY, FRACTURES): VITD: 27.79 ng/mL — ABNORMAL LOW (ref 30.00–100.00)

## 2021-10-06 LAB — HEMOGLOBIN A1C: Hgb A1c MFr Bld: 6.6 % — ABNORMAL HIGH (ref 4.6–6.5)

## 2021-10-06 LAB — LIPID PANEL
Cholesterol: 183 mg/dL (ref 0–200)
HDL: 63.3 mg/dL (ref >39.00)
LDL Cholesterol: 102 mg/dL — ABNORMAL HIGH (ref 0–99)
NonHDL: 120.06
Total CHOL/HDL Ratio: 3
Triglycerides: 88 mg/dL (ref 0.0–149.0)
VLDL: 17.6 mg/dL (ref 0.0–40.0)

## 2021-10-06 LAB — VITAMIN B12: Vitamin B-12: 579 pg/mL (ref 211–911)

## 2021-10-06 NOTE — Telephone Encounter (Signed)
Additional lab already ordered

## 2021-10-06 NOTE — Addendum Note (Signed)
Addended by: Ellamae Sia on: 10/06/2021 04:20 PM   Modules accepted: Orders

## 2021-10-13 DIAGNOSIS — Z23 Encounter for immunization: Secondary | ICD-10-CM | POA: Diagnosis not present

## 2021-10-24 ENCOUNTER — Other Ambulatory Visit: Payer: Self-pay | Admitting: Family Medicine

## 2021-10-24 NOTE — Telephone Encounter (Signed)
Last office visit 08/02/21 with Dr. Silvio Pate for jaw pain.  Last refilled 07/19/21 for #90 with no refills.  CPE scheduled for 05/22/2022.

## 2021-11-09 DIAGNOSIS — E7800 Pure hypercholesterolemia, unspecified: Secondary | ICD-10-CM | POA: Diagnosis not present

## 2021-11-09 DIAGNOSIS — I10 Essential (primary) hypertension: Secondary | ICD-10-CM | POA: Diagnosis not present

## 2021-11-17 DIAGNOSIS — M5416 Radiculopathy, lumbar region: Secondary | ICD-10-CM | POA: Diagnosis not present

## 2021-11-22 DIAGNOSIS — H353131 Nonexudative age-related macular degeneration, bilateral, early dry stage: Secondary | ICD-10-CM | POA: Diagnosis not present

## 2021-11-22 DIAGNOSIS — E119 Type 2 diabetes mellitus without complications: Secondary | ICD-10-CM | POA: Diagnosis not present

## 2021-11-28 DIAGNOSIS — H353131 Nonexudative age-related macular degeneration, bilateral, early dry stage: Secondary | ICD-10-CM | POA: Diagnosis not present

## 2021-12-06 DIAGNOSIS — M5416 Radiculopathy, lumbar region: Secondary | ICD-10-CM | POA: Diagnosis not present

## 2022-01-01 DIAGNOSIS — M5416 Radiculopathy, lumbar region: Secondary | ICD-10-CM | POA: Diagnosis not present

## 2022-01-30 ENCOUNTER — Telehealth: Payer: Self-pay | Admitting: Family Medicine

## 2022-01-30 NOTE — Telephone Encounter (Signed)
Pt called in requesting all her mebdcation be sent to the [facility] in [place] and to be a 90 day supply

## 2022-01-30 NOTE — Telephone Encounter (Signed)
Called not able to leave message. Need to find out what meds need to be filled.

## 2022-01-31 NOTE — Telephone Encounter (Addendum)
Pt called returning a missed call from [facility] regarding medication. I told the pt Joellen's response. Pt stated we should know what meds she's taking & not. Pt stated she wasn't sure what medications she's taking, she only knows 3 meds by heart, metFORMIN (GLUCOPHAGE) 500 MG tablet, hydrochlorothiazide (HYDRODIURIL) 25 MG tablet, ALPRAZolam (XANAX) 0.25 MG tablet. Pt is requesting a 90 day supply for those meds. Call back # [phone]

## 2022-01-31 NOTE — Telephone Encounter (Signed)
Okay to refill all medications as requested by patient for 90 days 1 refill to new pharmacy IF I [place].  Send any controlled prescription to me for signature... or send all to me for review.

## 2022-01-31 NOTE — Telephone Encounter (Deleted)
Pt called returning a missed call from [facility] regarding medication. I told the pt Joellen's response. Pt stated we should know what meds she's taking & not. Pt stated she wasn't sure what medications she's taking, she only knows 3 meds by heart,

## 2022-01-31 NOTE — Telephone Encounter (Signed)
Called patient states that she needs refill on all medications sent to updated pharmacy. Due to insurance she is needing to change pharmacy as well as all refills need to be 90 day.

## 2022-02-01 ENCOUNTER — Other Ambulatory Visit: Payer: Self-pay

## 2022-02-01 MED ORDER — METFORMIN HCL 500 MG PO TABS: 500.0000 mg | ORAL_TABLET | Freq: Two times a day (BID) | ORAL | 3 refills | Status: DC

## 2022-02-01 MED ORDER — LISINOPRIL 10 MG PO TABS: 10.0000 mg | ORAL_TABLET | Freq: Two times a day (BID) | ORAL | 3 refills | Status: DC

## 2022-02-01 MED ORDER — ALPRAZOLAM 0.25 MG PO TABS: ORAL_TABLET | ORAL | 0 refills | Status: DC

## 2022-02-01 MED ORDER — HYDROCHLOROTHIAZIDE 25 MG PO TABS: 25.0000 mg | ORAL_TABLET | Freq: Every day | ORAL | 3 refills | Status: DC

## 2022-02-01 NOTE — Telephone Encounter (Signed)
Need a 90 days refill sent to new pharmacy

## 2022-02-02 NOTE — Telephone Encounter (Signed)
Medication has been sent to the pharmacy. 

## 2022-02-05 ENCOUNTER — Other Ambulatory Visit: Payer: Self-pay | Admitting: Family Medicine

## 2022-02-05 DIAGNOSIS — E1159 Type 2 diabetes mellitus with other circulatory complications: Secondary | ICD-10-CM

## 2022-02-07 DIAGNOSIS — M5416 Radiculopathy, lumbar region: Secondary | ICD-10-CM | POA: Diagnosis not present

## 2022-03-01 ENCOUNTER — Other Ambulatory Visit: Payer: Self-pay | Admitting: Family Medicine

## 2022-03-05 ENCOUNTER — Telehealth: Payer: Self-pay | Admitting: Family Medicine

## 2022-03-05 NOTE — Telephone Encounter (Signed)
Will assess at the visit tomorrow morning

## 2022-03-05 NOTE — Telephone Encounter (Signed)
Patient called in today stating that fluid started  draining out of her leg on last night. She called in requesting to speak to a nurse,I sent her to access nurse to be triaged.

## 2022-03-05 NOTE — Telephone Encounter (Signed)
Pt a walk in complains of "fluid leaking" from her leg.  Area located on left lower leg.  Pt has area dressed with 3 x 2 in bandage and folded gauze.  Pt has serous fluid leaking from a opening the size of a ball point pen.  The area is not macerated or painful.  Appointment made with Dr. Silvio Pate on 03/06/22 @ 11:15a.

## 2022-03-05 NOTE — Telephone Encounter (Signed)
Have not got Access nurse note; wonder with time line if this might have been access nurse note with no info.  [facility] - Client Nonclinical Telephone Record  [facility] Client [facility] - Client Client Site [place] - Day Provider Eliezer Lofts - MD Contact Type Call Who Is Calling Patient / Member / Family / Caregiver Caller Name Declined to provide Caller Phone Number Declined to provide Call Type Message Only Information Provided Reason for Call Request for General Office Information Initial Comment Provided general information. Additional Comment Provided general information. Disp. Time Disposition Final User 03/05/2022 2:26:48 PM General Information Provided Yes Rhea Belton Call Closed By: Rhea Belton Transaction Date/Time: 03/05/2022 2:23:59 PM (ET

## 2022-03-05 NOTE — Telephone Encounter (Signed)
Pt is at office and speaking with Cordelia Pen Lead RN. Sending note to [employer].

## 2022-03-06 ENCOUNTER — Encounter: Payer: Self-pay | Admitting: Internal Medicine

## 2022-03-06 ENCOUNTER — Ambulatory Visit (INDEPENDENT_AMBULATORY_CARE_PROVIDER_SITE_OTHER): Payer: Medicare Other | Admitting: Internal Medicine

## 2022-03-06 VITALS — BP 138/68 | HR 83 | Temp 97.8°F | Ht 65.0 in | Wt 180.0 lb

## 2022-03-06 DIAGNOSIS — L97901 Non-pressure chronic ulcer of unspecified part of unspecified lower leg limited to breakdown of skin: Secondary | ICD-10-CM

## 2022-03-06 DIAGNOSIS — L98419 Non-pressure chronic ulcer of buttock with unspecified severity: Secondary | ICD-10-CM | POA: Insufficient documentation

## 2022-03-06 DIAGNOSIS — I890 Lymphedema, not elsewhere classified: Secondary | ICD-10-CM

## 2022-03-06 DIAGNOSIS — L97909 Non-pressure chronic ulcer of unspecified part of unspecified lower leg with unspecified severity: Secondary | ICD-10-CM | POA: Insufficient documentation

## 2022-03-06 NOTE — Assessment & Plan Note (Signed)
Very superficial No action needed (other than the support hose)

## 2022-03-06 NOTE — Assessment & Plan Note (Signed)
She had problems even back in high school Did well with support hose in the past--so I recommended that again Cut out the salt---uses 50/50 sodium/potassium salt now. 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

## 2022-03-06 NOTE — Progress Notes (Signed)
Subjective:    Patient ID: [name], female    DOB: [birth year], 82 y.o.   MRN: [id number]  HPI Here due to weeping from her left leg  Started 2 days ago Fluid just "rolling down my leg" Soaking through dressings and they were coming off Now just damp there Went to walk in clinics and 3-5 hour weight Came in yesterday--dressing changed and this appointment set up  Has had some increased swelling--noticed ~2 weeks ago Remembers more severe swelling many years ago---saw someone and started using compression hose Left more than right  No trauma to legs No recent travel No SOB No chest pain No palpitations No dizziness or syncope  Current Outpatient Medications on File Prior to Visit  Medication Sig Dispense Refill   ALPRAZolam (XANAX) 0.25 MG tablet TAKE ONE TABLET BY MOUTH ONE TIME DAILY AS NEEDED 30 tablet 0   Cyanocobalamin (VITAMIN B-12) 2500 MCG SUBL Place under the tongue.     glucose blood (FREESTYLE TEST STRIPS) test strip USE TO TEST BLOOD SUGAR ONCE DAILY 100 strip 3   hydrochlorothiazide (HYDRODIURIL) 25 MG tablet Take 1 tablet (25 mg total) by mouth daily. 90 tablet 3   Lancets (FREESTYLE) lancets USE TO CHECK BLOOD SUGAR DAILY AS DIRECTED 100 each 3   lisinopril (ZESTRIL) 10 MG tablet Take 1 tablet (10 mg total) by mouth 2 (two) times daily. 180 tablet 3   metFORMIN (GLUCOPHAGE) 500 MG tablet Take 1 tablet (500 mg total) by mouth 2 (two) times daily with a meal. 180 tablet 3   Multiple Vitamins-Minerals (PRESERVISION AREDS 2 PO) Take by mouth.     ursodiol (ACTIGALL) 300 MG capsule Take 2 capsules by mouth in the morning and 1 capsule by mouth in the evening     No current facility-administered medications on file prior to visit.    Allergies  Allergen Reactions   Codeine Anaphylaxis    Bad respiratory problems   Demerol [Meperidine] Anaphylaxis    She thinks it has codeine in it    Talwin [Pentazocine] Anaphylaxis    Affects liver and respiratory  problems   Tessalon [Benzonatate] Anaphylaxis    Trachea will get so inflammed   Vicodin [Hydrocodone-Acetaminophen] Anaphylaxis    Major respiratory   Betadine [Povidone Iodine]     Skin sloughes off   Tape    Calcium Channel Blockers Rash    Double vision and rash; cant even take CA2+ pills   Iodine Rash    Rash and respiratory   Neosporin [Neomycin-Bacitracin Zn-Polymyx] Rash   Sulfa Antibiotics Rash    Past Medical History:  Diagnosis Date   Diabetes mellitus ([place])    Environmental allergies    Hypertension    Liver failure ([place])    Pancreatitis     Past Surgical History:  Procedure Laterality Date   CARPAL TUNNEL RELEASE  8/91   CHOLECYSTECTOMY  10/80   COLONOSCOPY     ERCP  3/95   fusion on fingers  2000 2001   INCONTINENCE SURGERY  09/17/00   KNEE ARTHROPLASTY  10/25/95   x2   KNEE ARTHROSCOPY  7/92, 93, 94   x3   LIVER BIOPSY  5/95   perforated colon  3/07   SHOULDER ARTHROSCOPY  3/92   spur cut muscle   sigmoid colon surgery  07/17/05    Family History  Problem Relation Age of Onset   Colon cancer Neg Hx    Colon polyps Neg Hx     Social  History   Socioeconomic History   Marital status: Married    Spouse name: Carlette Palmatier   Number of children: 2   Years of education: Not on file   Highest education level: Not on file  Occupational History   Not on file  Tobacco Use   Smoking status: Never    Passive exposure: Past (as a child)   Smokeless tobacco: Never  Substance and Sexual Activity   Alcohol use: Yes    Alcohol/week: 0.0 standard drinks of alcohol    Comment: 4oz wine 2 times yearly   Drug use: No   Sexual activity: Not on file  Other Topics Concern   Not on file  Social History Narrative   Right Handed    Lives in a one story home   Social Determinants of Health   Financial Resource Strain: Not on file  Food Insecurity: Not on file  Transportation Needs: Not on file  Physical Activity: Not on file  Stress: Not on file   Social Connections: Not on file  Intimate Partner Violence: Not on file   Review of Systems Does use salt GFR in 30's    Objective:   Physical Exam Constitutional:      Appearance: Normal appearance.  Cardiovascular:     Rate and Rhythm: Normal rate and regular rhythm.     Pulses: Normal pulses.     Heart sounds: No murmur heard.    No gallop.  Pulmonary:     Effort: Pulmonary effort is normal.     Breath sounds: Normal breath sounds. No wheezing or rales.  Musculoskeletal:     Cervical back: Neck supple.     Comments: Symmetric thick thighs and calves--no pitting  Lymphadenopathy:     Cervical: No cervical adenopathy.  Skin:    Comments: 2 very superficial ulcers with eschar on left lower calf. She states the weeping was coming from a different spot  Neurological:     Mental Status: She is alert.            Assessment & Plan:

## 2022-03-08 ENCOUNTER — Other Ambulatory Visit: Payer: Self-pay | Admitting: Family Medicine

## 2022-03-08 NOTE — Telephone Encounter (Signed)
Last office visit 03/06/22 with Dr. Silvio Pate for Lymphedema and Ulcer of lower extremity.  Last refilled 02/01/22 for #30 with no refills.  Next Appt: 05/22/22 for CPE with Dr. Diona Browner.

## 2022-04-10 ENCOUNTER — Other Ambulatory Visit: Payer: Self-pay | Admitting: Family Medicine

## 2022-04-10 DIAGNOSIS — R5383 Other fatigue: Secondary | ICD-10-CM

## 2022-04-10 DIAGNOSIS — E1159 Type 2 diabetes mellitus with other circulatory complications: Secondary | ICD-10-CM

## 2022-04-10 DIAGNOSIS — E559 Vitamin D deficiency, unspecified: Secondary | ICD-10-CM

## 2022-04-10 DIAGNOSIS — E1169 Type 2 diabetes mellitus with other specified complication: Secondary | ICD-10-CM

## 2022-04-10 DIAGNOSIS — E538 Deficiency of other specified B group vitamins: Secondary | ICD-10-CM

## 2022-04-10 NOTE — Telephone Encounter (Signed)
Last office visit 03/06/22 with Dr. Silvio Pate for lymphedema.  Last refilled 03/08/22 for #30 with no refills.  Next Appt: 05/22/2022 for CPE.

## 2022-04-23 DIAGNOSIS — H353131 Nonexudative age-related macular degeneration, bilateral, early dry stage: Secondary | ICD-10-CM | POA: Diagnosis not present

## 2022-04-23 DIAGNOSIS — Z961 Presence of intraocular lens: Secondary | ICD-10-CM | POA: Diagnosis not present

## 2022-04-26 ENCOUNTER — Telehealth: Payer: Self-pay | Admitting: Family Medicine

## 2022-04-26 NOTE — Telephone Encounter (Signed)
Contacted [name] to schedule their annual wellness visit. Appointment made for 05/21/2022.  [place] Direct Dial: [phone]

## 2022-04-30 NOTE — Telephone Encounter (Signed)
-----   Message from Ellamae Sia sent at 04/30/2022 10:23 AM EDT ----- Regarding: Lab orders for Wednesday, 4.17.24 Patient is scheduled for CPX labs, please order future labs, Thanks , Karna Christmas

## 2022-05-15 ENCOUNTER — Other Ambulatory Visit: Payer: Medicare Other

## 2022-05-16 ENCOUNTER — Other Ambulatory Visit (INDEPENDENT_AMBULATORY_CARE_PROVIDER_SITE_OTHER): Payer: Medicare Other

## 2022-05-16 DIAGNOSIS — E559 Vitamin D deficiency, unspecified: Secondary | ICD-10-CM | POA: Diagnosis not present

## 2022-05-16 DIAGNOSIS — E1169 Type 2 diabetes mellitus with other specified complication: Secondary | ICD-10-CM | POA: Diagnosis not present

## 2022-05-16 DIAGNOSIS — E1159 Type 2 diabetes mellitus with other circulatory complications: Secondary | ICD-10-CM | POA: Diagnosis not present

## 2022-05-16 DIAGNOSIS — R5383 Other fatigue: Secondary | ICD-10-CM | POA: Diagnosis not present

## 2022-05-16 DIAGNOSIS — E538 Deficiency of other specified B group vitamins: Secondary | ICD-10-CM

## 2022-05-16 DIAGNOSIS — E785 Hyperlipidemia, unspecified: Secondary | ICD-10-CM | POA: Diagnosis not present

## 2022-05-16 LAB — CBC WITH DIFFERENTIAL/PLATELET
Basophils Absolute: 0.1 10*3/uL (ref 0.0–0.1)
Basophils Relative: 1.1 % (ref 0.0–3.0)
Eosinophils Absolute: 0.2 10*3/uL (ref 0.0–0.7)
Eosinophils Relative: 3 % (ref 0.0–5.0)
HCT: 32.5 % — ABNORMAL LOW (ref 36.0–46.0)
Hemoglobin: 10.9 g/dL — ABNORMAL LOW (ref 12.0–15.0)
Lymphocytes Relative: 24.6 % (ref 12.0–46.0)
Lymphs Abs: 1.6 10*3/uL (ref 0.7–4.0)
MCHC: 33.4 g/dL (ref 30.0–36.0)
MCV: 87 fl (ref 78.0–100.0)
Monocytes Absolute: 0.6 10*3/uL (ref 0.1–1.0)
Monocytes Relative: 8.9 % (ref 3.0–12.0)
Neutro Abs: 4 10*3/uL (ref 1.4–7.7)
Neutrophils Relative %: 62.4 % (ref 43.0–77.0)
Platelets: 352 10*3/uL (ref 150.0–400.0)
RBC: 3.74 Mil/uL — ABNORMAL LOW (ref 3.87–5.11)
RDW: 13.6 % (ref 11.5–15.5)
WBC: 6.4 10*3/uL (ref 4.0–10.5)

## 2022-05-16 LAB — MICROALBUMIN / CREATININE URINE RATIO
Creatinine,U: 160.8 mg/dL
Microalb Creat Ratio: 0.8 mg/g (ref 0.0–30.0)
Microalb, Ur: 1.3 mg/dL (ref 0.0–1.9)

## 2022-05-16 LAB — COMPREHENSIVE METABOLIC PANEL
ALT: 11 U/L (ref 0–35)
AST: 19 U/L (ref 0–37)
Albumin: 4.4 g/dL (ref 3.5–5.2)
Alkaline Phosphatase: 56 U/L (ref 39–117)
BUN: 35 mg/dL — ABNORMAL HIGH (ref 6–23)
CO2: 25 mEq/L (ref 19–32)
Calcium: 9.6 mg/dL (ref 8.4–10.5)
Chloride: 104 mEq/L (ref 96–112)
Creatinine, Ser: 1.15 mg/dL (ref 0.40–1.20)
GFR: 44.76 mL/min — ABNORMAL LOW (ref >60.00)
Glucose, Bld: 97 mg/dL (ref 70–99)
Potassium: 3.9 mEq/L (ref 3.5–5.1)
Sodium: 138 mEq/L (ref 135–145)
Total Bilirubin: 0.4 mg/dL (ref 0.2–1.2)
Total Protein: 7.6 g/dL (ref 6.0–8.3)

## 2022-05-16 LAB — LIPID PANEL
Cholesterol: 176 mg/dL (ref 0–200)
HDL: 58.3 mg/dL (ref >39.00)
LDL Cholesterol: 95 mg/dL (ref 0–99)
NonHDL: 117.24
Total CHOL/HDL Ratio: 3
Triglycerides: 113 mg/dL (ref 0.0–149.0)
VLDL: 22.6 mg/dL (ref 0.0–40.0)

## 2022-05-16 LAB — VITAMIN D 25 HYDROXY (VIT D DEFICIENCY, FRACTURES): VITD: 27.37 ng/mL — ABNORMAL LOW (ref 30.00–100.00)

## 2022-05-16 LAB — VITAMIN B12: Vitamin B-12: 464 pg/mL (ref 211–911)

## 2022-05-16 LAB — HEMOGLOBIN A1C: Hgb A1c MFr Bld: 6.5 % (ref 4.6–6.5)

## 2022-05-17 NOTE — Progress Notes (Signed)
No critical labs need to be addressed urgently. We will discuss labs in detail at upcoming office visit.   

## 2022-05-21 ENCOUNTER — Ambulatory Visit (INDEPENDENT_AMBULATORY_CARE_PROVIDER_SITE_OTHER): Payer: Medicare Other

## 2022-05-21 VITALS — Ht 67.0 in | Wt 171.0 lb

## 2022-05-21 DIAGNOSIS — Z0000 Encounter for general adult medical examination without abnormal findings: Secondary | ICD-10-CM

## 2022-05-21 NOTE — Progress Notes (Signed)
I connected with  [name] on 05/21/22 by a audio enabled telemedicine application and verified that I am speaking with the correct person using two identifiers.  Patient Location: Home  Provider Location: Home Office  I discussed the limitations of evaluation and management by telemedicine. The patient expressed understanding and agreed to proceed.  Subjective:   [name] is a 81 y.o. female who presents for Medicare Annual (Subsequent) preventive examination.  Review of Systems      Cardiac Risk Factors include: advanced age (>9men, >24 women);hypertension;diabetes mellitus     Objective:    Today's Vitals   05/21/22 1138  Weight: 171 lb (77.6 kg)  Height:  (1.702 m)   Body mass index is 26.78 kg/m.     05/21/2022   11:51 AM 09/19/2020    9:01 AM 06/06/2018    9:47 AM 09/15/2014   11:05 AM  Advanced Directives  Does Patient Have a Medical Advance Directive? Yes Yes Yes Yes  Type of Estate agent of [place];Living will Healthcare Power of [place];Out of facility DNR (pink MOST or yellow form);Living will Healthcare Power of [place];Living will Healthcare Power of [place];Living will  Does patient want to make changes to medical advance directive?    No - Patient declined  Copy of Healthcare Power of Attorney in Chart? No - copy requested  No - copy requested No - copy requested    Current Medications (verified) Outpatient Encounter Medications as of 05/21/2022  Medication Sig   ALPRAZolam (XANAX) 0.25 MG tablet TAKE 1 TABLET BY MOUTH EVERY DAY AS NEEDED   glucose blood (FREESTYLE TEST STRIPS) test strip USE TO TEST BLOOD SUGAR ONCE DAILY   hydrochlorothiazide (HYDRODIURIL) 25 MG tablet Take 1 tablet (25 mg total) by mouth daily.   Lancets (FREESTYLE) lancets USE TO CHECK BLOOD SUGAR DAILY AS DIRECTED   lisinopril (ZESTRIL) 10 MG tablet Take 1 tablet (10 mg total) by mouth 2 (two) times daily.   metFORMIN (GLUCOPHAGE) 500 MG tablet  Take 1 tablet (500 mg total) by mouth 2 (two) times daily with a meal.   Multiple Vitamins-Minerals (PRESERVISION AREDS 2 PO) Take by mouth.   ursodiol (ACTIGALL) 300 MG capsule Take 2 capsules by mouth in the morning and 1 capsule by mouth in the evening   Cyanocobalamin (VITAMIN B-12) 2500 MCG SUBL Place under the tongue. (Patient not taking: Reported on 05/21/2022)   No facility-administered encounter medications on file as of 05/21/2022.    Allergies (verified) Codeine, Demerol [meperidine], Talwin [pentazocine], Tessalon [benzonatate], Vicodin [hydrocodone-acetaminophen], Betadine [povidone iodine], Tape, Calcium channel blockers, Iodine, Neosporin [neomycin-bacitracin zn-polymyx], and Sulfa antibiotics   History: Past Medical History:  Diagnosis Date   Diabetes mellitus    Environmental allergies    Hypertension    Liver failure    Pancreatitis    Past Surgical History:  Procedure Laterality Date   CARPAL TUNNEL RELEASE  8/91   CHOLECYSTECTOMY  10/80   COLONOSCOPY     ERCP  3/95   fusion on fingers  2000 2001   INCONTINENCE SURGERY  09/17/00   KNEE ARTHROPLASTY  10/25/95   x2   KNEE ARTHROSCOPY  7/92, 93, 94   x3   LIVER BIOPSY  5/95   perforated colon  3/07   SHOULDER ARTHROSCOPY  3/92   spur cut muscle   sigmoid colon surgery  07/17/05   Family History  Problem Relation Age of Onset   Colon cancer Neg Hx    Colon polyps  Neg Hx    Social History   Socioeconomic History   Marital status: Married    Spouse name: Hatice Bubel   Number of children: 2   Years of education: Not on file   Highest education level: Not on file  Occupational History   Not on file  Tobacco Use   Smoking status: Never    Passive exposure: Past (as a child)   Smokeless tobacco: Never  Substance and Sexual Activity   Alcohol use: Yes    Alcohol/week: 0.0 standard drinks of alcohol    Comment: 4oz wine 2 times yearly   Drug use: No   Sexual activity: Not on file  Other Topics  Concern   Not on file  Social History Narrative   Right Handed    Lives in a one story home   Social Determinants of Health   Financial Resource Strain: Low Risk  (05/21/2022)   Overall Financial Resource Strain (CARDIA)    Difficulty of Paying Living Expenses: Not hard at all  Food Insecurity: No Food Insecurity (05/21/2022)   Hunger Vital Sign    Worried About Running Out of Food in the Last Year: Never true    Ran Out of Food in the Last Year: Never true  Transportation Needs: No Transportation Needs (05/21/2022)   PRAPARE - Administrator, Civil Service (Medical): No    Lack of Transportation (Non-Medical): No  Physical Activity: Inactive (05/21/2022)   Exercise Vital Sign    Days of Exercise per Week: 0 days    Minutes of Exercise per Session: 0 min  Stress: No Stress Concern Present (05/21/2022)   [employer] of Occupational Health - Occupational Stress Questionnaire    Feeling of Stress : Not at all  Social Connections: Moderately Integrated (05/21/2022)   Social Connection and Isolation Panel [NHANES]    Frequency of Communication with Friends and Family: More than three times a week    Frequency of Social Gatherings with Friends and Family: More than three times a week    Attends Religious Services: Never    Database administrator or Organizations: Yes    Attends Engineer, structural: More than 4 times per year    Marital Status: Married    Tobacco Counseling Counseling given: Not Answered   Clinical Intake:  Pre-visit preparation completed: Yes  Pain : No/denies pain     Nutritional Risks: None Diabetes: No  How often do you need to have someone help you when you read instructions, pamphlets, or other written materials from your doctor or pharmacy?: 1 - Never  Diabetic? no  Interpreter Needed?: No  Information entered by :: C.Kenji Mapel LPN   Activities of Daily Living    05/21/2022   11:52 AM  In your present state of health,  do you have any difficulty performing the following activities:  Hearing? 0  Vision? 0  Difficulty concentrating or making decisions? 0  Walking or climbing stairs? 0  Dressing or bathing? 0  Doing errands, shopping? 0  Preparing Food and eating ? N  Using the Toilet? N  In the past six months, have you accidently leaked urine? N  Do you have problems with loss of bowel control? N  Managing your Medications? N  Managing your Finances? N  Housekeeping or managing your Housekeeping? N    Patient Care Team: Excell Seltzer, MD as PCP - General (Family Medicine) Phil Dopp, [facility] as Pharmacist (Pharmacist) Glendale Chard, DO as Consulting  Physician (Neurology)  Indicate any recent Medical Services you may have received from other than [facility] providers in the past year (date may be approximate).     Assessment:   This is a routine wellness examination for Ilissa.  Hearing/Vision screen Hearing Screening - Comments:: aids Vision Screening - Comments:: Readers - [facility]  Dietary issues and exercise activities discussed: Current Exercise Habits: The patient does not participate in regular exercise at present, Exercise limited by: None identified   Goals Addressed             This Visit's Progress    Patient Stated       No new goals.       Depression Screen    05/21/2022   11:48 AM 05/21/2022   11:47 AM 05/19/2021   10:34 AM 05/17/2020   10:14 AM 05/14/2019   10:26 AM 06/06/2018    9:58 AM 05/31/2017   11:12 AM  PHQ 2/9 Scores  PHQ - 2 Score 0 0 0 0 0 0 0  PHQ- 9 Score   0   0     Fall Risk    05/21/2022   11:51 AM 05/19/2021   10:33 AM 05/19/2021   10:29 AM 09/19/2020    9:01 AM 05/17/2020   10:14 AM  Fall Risk   Falls in the past year? 0 0 0 1 1  Number falls in past yr: 0 0 0 0 0  Injury with Fall? 0 0 0 1 0  Risk for fall due to : No Fall Risks No Fall Risks No Fall Risks    Follow up Falls prevention discussed;Falls evaluation completed;Education provided  Falls evaluation completed Falls evaluation completed      FALL RISK PREVENTION PERTAINING TO THE HOME:  Any stairs in or around the home? Yes  If so, are there any without handrails? No  Home free of loose throw rugs in walkways, pet beds, electrical cords, etc? Yes  Adequate lighting in your home to reduce risk of falls? Yes   ASSISTIVE DEVICES UTILIZED TO PREVENT FALLS:  Life alert? No  Use of a cane, walker or w/c? Yes  Grab bars in the bathroom? Yes  Shower chair or bench in shower? Yes  Elevated toilet seat or a handicapped toilet? Yes   Cognitive Function:    06/06/2018    9:58 AM  MMSE - Mini Mental State Exam  Orientation to time 5  Orientation to Place 5  Registration 3  Attention/ Calculation 0  Recall 3  Language- name 2 objects 0  Language- repeat 1  Language- follow 3 step command 0  Language- read & follow direction 0  Write a sentence 0  Copy design 0  Total score 17        05/21/2022   11:54 AM  6CIT Screen  What Year? 0 points  What month? 0 points  What time? 0 points  Count back from 20 0 points  Months in reverse 0 points  Repeat phrase 0 points  Total Score 0 points    Immunizations Immunization History  Administered Date(s) Administered   Influenza, High Dose Seasonal PF 10/01/2014, 10/18/2016, 10/25/2017   Influenza-Unspecified 10/09/2013   PFIZER Comirnaty(Gray Top)Covid-19 Tri-Sucrose Vaccine 02/19/2019, 03/13/2019   PFIZER(Purple Top)SARS-COV-2 Vaccination 02/19/2019, 03/13/2019, 11/06/2019   Pneumococcal Conjugate-13 04/21/2014   Pneumococcal Polysaccharide-23 05/15/2016   Td 10/30/2003   Zoster, Live 04/30/2006    TDAP status: Due, Education has been provided regarding the importance of this vaccine. Advised  may receive this vaccine at local pharmacy or Health Dept. Aware to provide a copy of the vaccination record if obtained from local pharmacy or Health Dept. Verbalized acceptance and understanding.  Flu Vaccine status:  Declined, Education has been provided regarding the importance of this vaccine but patient still declined. Advised may receive this vaccine at local pharmacy or Health Dept. Aware to provide a copy of the vaccination record if obtained from local pharmacy or Health Dept. Verbalized acceptance and understanding.  Pneumococcal vaccine status: Up to date  Covid-19 vaccine status: Information provided on how to obtain vaccines.   Qualifies for Shingles Vaccine? No   Zostavax completed No   Shingrix Completed?: No.    Education has been provided regarding the importance of this vaccine. Patient has been advised to call insurance company to determine out of pocket expense if they have not yet received this vaccine. Advised may also receive vaccine at local pharmacy or Health Dept. Verbalized acceptance and understanding.  Screening Tests Health Maintenance  Topic Date Due   Zoster Vaccines- Shingrix (1 of 2) Never done   DEXA SCAN  Never done   DTaP/Tdap/Td (2 - Tdap) 10/29/2013   COVID-19 Vaccine (6 - 2023-24 season) 09/29/2021   FOOT EXAM  05/02/2022   INFLUENZA VACCINE  08/30/2022   OPHTHALMOLOGY EXAM  08/30/2022   HEMOGLOBIN A1C  11/15/2022   Diabetic kidney evaluation - eGFR measurement  05/16/2023   Diabetic kidney evaluation - Urine ACR  05/16/2023   Medicare Annual Wellness (AWV)  05/21/2023   Pneumonia Vaccine 46+ Years old  Completed   HPV VACCINES  Aged Out    Health Maintenance  Health Maintenance Due  Topic Date Due   Zoster Vaccines- Shingrix (1 of 2) Never done   DEXA SCAN  Never done   DTaP/Tdap/Td (2 - Tdap) 10/29/2013   COVID-19 Vaccine (6 - 2023-24 season) 09/29/2021   FOOT EXAM  05/02/2022    Colorectal cancer screening: No longer required.   Mammogram status: No longer required due to age.  Bone density -Declined  Lung Cancer Screening: (Low Dose CT Chest recommended if Age 76-80 years, 30 pack-year currently smoking OR have quit w/in 15years.) does not  qualify.   Lung Cancer Screening Referral: no  Additional Screening:  Hepatitis C Screening: does not qualify; Completed no  Vision Screening: Recommended annual ophthalmology exams for early detection of glaucoma and other disorders of the eye. Is the patient up to date with their annual eye exam?  Yes  Who is the provider or what is the name of the office in which the patient attends annual eye exams? [facility] If pt is not established with a provider, would they like to be referred to a provider to establish care? No .   Dental Screening: Recommended annual dental exams for proper oral hygiene  Community Resource Referral / Chronic Care Management: CRR required this visit?  No   CCM required this visit?  No      Plan:     I have personally reviewed and noted the following in the patient's chart:   Medical and social history Use of alcohol, tobacco or illicit drugs  Current medications and supplements including opioid prescriptions. Patient is not currently taking opioid prescriptions. Functional ability and status Nutritional status Physical activity Advanced directives List of other physicians Hospitalizations, surgeries, and ER visits in previous 12 months Vitals Screenings to include cognitive, depression, and falls Referrals and appointments  In addition, I have reviewed and discussed  with patient certain preventive protocols, quality metrics, and best practice recommendations. A written personalized care plan for preventive services as well as general preventive health recommendations were provided to patient.     Maryan Puls, LPN   09/27/5619   Nurse Notes: Vaccinations: declines all Influenza vaccine: recommend every Fall Tdap vaccine: recommend every 10 years Shingles vaccine: recommend Shingrix which is 2 doses 2-6 months apart and over 90% effective     Covid-19: recommend 2 doses one month apart with a booster 6 months later

## 2022-05-21 NOTE — Patient Instructions (Addendum)
[name] , Thank you for taking time to come for your Medicare Wellness Visit. I appreciate your ongoing commitment to your health goals. Please review the following plan we discussed and let me know if I can assist you in the future.   These are the goals we discussed:  Goals      Patient Stated     Starting 06/06/18, I will continue to take medications as prescribed.      Patient Stated     No new goals.        This is a list of the screening recommended for you and due dates:  Health Maintenance  Topic Date Due   Zoster (Shingles) Vaccine (1 of 2) Never done   DEXA scan (bone density measurement)  Never done   DTaP/Tdap/Td vaccine (2 - Tdap) 10/29/2013   COVID-19 Vaccine (6 - 2023-24 season) 09/29/2021   Complete foot exam   05/02/2022   Flu Shot  08/30/2022   Eye exam for diabetics  08/30/2022   Hemoglobin A1C  11/15/2022   Yearly kidney function blood test for diabetes  05/16/2023   Yearly kidney health urinalysis for diabetes  05/16/2023   Medicare Annual Wellness Visit  05/21/2023   Pneumonia Vaccine  Completed   HPV Vaccine  Aged Out    Advanced directives: Please bring a copy of your health care power of attorney and living will to the office to be added to your chart at your convenience.   Conditions/risks identified: Aim for 30 minutes of exercise or brisk walking, 6-8 glasses of water, and 5 servings of fruits and vegetables each day.   Next appointment: Follow up in one year for your annual wellness visit 05/22/23 @ 1:30 Telephone   Preventive Care 65 Years and Older, Female Preventive care refers to lifestyle choices and visits with your health care provider that can promote health and wellness. What does preventive care include? A yearly physical exam. This is also called an annual well check. Dental exams once or twice a year. Routine eye exams. Ask your health care provider how often you should have your eyes checked. Personal lifestyle choices,  including: Daily care of your teeth and gums. Regular physical activity. Eating a healthy diet. Avoiding tobacco and drug use. Limiting alcohol use. Practicing safe sex. Taking low-dose aspirin every day. Taking vitamin and mineral supplements as recommended by your health care provider. What happens during an annual well check? The services and screenings done by your health care provider during your annual well check will depend on your age, overall health, lifestyle risk factors, and family history of disease. Counseling  Your health care provider may ask you questions about your: Alcohol use. Tobacco use. Drug use. Emotional well-being. Home and relationship well-being. Sexual activity. Eating habits. History of falls. Memory and ability to understand (cognition). Work and work Astronomer. Reproductive health. Screening  You may have the following tests or measurements: Height, weight, and BMI. Blood pressure. Lipid and cholesterol levels. These may be checked every 5 years, or more frequently if you are over 26 years old. Skin check. Lung cancer screening. You may have this screening every year starting at age 59 if you have a 30-pack-year history of smoking and currently smoke or have quit within the past 15 years. Fecal occult blood test (FOBT) of the stool. You may have this test every year starting at age 53. Flexible sigmoidoscopy or colonoscopy. You may have a sigmoidoscopy every 5 years or a colonoscopy every 10  years starting at age 38. Hepatitis C blood test. Hepatitis B blood test. Sexually transmitted disease (STD) testing. Diabetes screening. This is done by checking your blood sugar (glucose) after you have not eaten for a while (fasting). You may have this done every 1-3 years. Bone density scan. This is done to screen for osteoporosis. You may have this done starting at age 35. Mammogram. This may be done every 1-2 years. Talk to your health care provider  about how often you should have regular mammograms. Talk with your health care provider about your test results, treatment options, and if necessary, the need for more tests. Vaccines  Your health care provider may recommend certain vaccines, such as: Influenza vaccine. This is recommended every year. Tetanus, diphtheria, and acellular pertussis (Tdap, Td) vaccine. You may need a Td booster every 10 years. Zoster vaccine. You may need this after age 73. Pneumococcal 13-valent conjugate (PCV13) vaccine. One dose is recommended after age 46. Pneumococcal polysaccharide (PPSV23) vaccine. One dose is recommended after age 42. Talk to your health care provider about which screenings and vaccines you need and how often you need them. This information is not intended to replace advice given to you by your health care provider. Make sure you discuss any questions you have with your health care provider. Document Released: 02/11/2015 Document Revised: 10/05/2015 Document Reviewed: 11/16/2014 Elsevier Interactive Patient Education  2017 [place] Prevention in the Home Falls can cause injuries. They can happen to people of all ages. There are many things you can do to make your home safe and to help prevent falls. What can I do on the outside of my home? Regularly fix the edges of walkways and driveways and fix any cracks. Remove anything that might make you trip as you walk through a door, such as a raised step or threshold. Trim any bushes or trees on the path to your home. Use bright outdoor lighting. Clear any walking paths of anything that might make someone trip, such as rocks or tools. Regularly check to see if handrails are loose or broken. Make sure that both sides of any steps have handrails. Any raised decks and porches should have guardrails on the edges. Have any leaves, snow, or ice cleared regularly. Use sand or salt on walking paths during winter. Clean up any spills in  your garage right away. This includes oil or grease spills. What can I do in the bathroom? Use night lights. Install grab bars by the toilet and in the tub and shower. Do not use towel bars as grab bars. Use non-skid mats or decals in the tub or shower. If you need to sit down in the shower, use a plastic, non-slip stool. Keep the floor dry. Clean up any water that spills on the floor as soon as it happens. Remove soap buildup in the tub or shower regularly. Attach bath mats securely with double-sided non-slip rug tape. Do not have throw rugs and other things on the floor that can make you trip. What can I do in the bedroom? Use night lights. Make sure that you have a light by your bed that is easy to reach. Do not use any sheets or blankets that are too big for your bed. They should not hang down onto the floor. Have a firm chair that has side arms. You can use this for support while you get dressed. Do not have throw rugs and other things on the floor that can make you trip.  What can I do in the kitchen? Clean up any spills right away. Avoid walking on wet floors. Keep items that you use a lot in easy-to-reach places. If you need to reach something above you, use a strong step stool that has a grab bar. Keep electrical cords out of the way. Do not use floor polish or wax that makes floors slippery. If you must use wax, use non-skid floor wax. Do not have throw rugs and other things on the floor that can make you trip. What can I do with my stairs? Do not leave any items on the stairs. Make sure that there are handrails on both sides of the stairs and use them. Fix handrails that are broken or loose. Make sure that handrails are as long as the stairways. Check any carpeting to make sure that it is firmly attached to the stairs. Fix any carpet that is loose or worn. Avoid having throw rugs at the top or bottom of the stairs. If you do have throw rugs, attach them to the floor with carpet  tape. Make sure that you have a light switch at the top of the stairs and the bottom of the stairs. If you do not have them, ask someone to add them for you. What else can I do to help prevent falls? Wear shoes that: Do not have high heels. Have rubber bottoms. Are comfortable and fit you well. Are closed at the toe. Do not wear sandals. If you use a stepladder: Make sure that it is fully opened. Do not climb a closed stepladder. Make sure that both sides of the stepladder are locked into place. Ask someone to hold it for you, if possible. Clearly mark and make sure that you can see: Any grab bars or handrails. First and last steps. Where the edge of each step is. Use tools that help you move around (mobility aids) if they are needed. These include: Canes. Walkers. Scooters. Crutches. Turn on the lights when you go into a dark area. Replace any light bulbs as soon as they burn out. Set up your furniture so you have a clear path. Avoid moving your furniture around. If any of your floors are uneven, fix them. If there are any pets around you, be aware of where they are. Review your medicines with your doctor. Some medicines can make you feel dizzy. This can increase your chance of falling. Ask your doctor what other things that you can do to help prevent falls. This information is not intended to replace advice given to you by your health care provider. Make sure you discuss any questions you have with your health care provider. Document Released: 11/11/2008 Document Revised: 06/23/2015 Document Reviewed: 02/19/2014 Elsevier Interactive Patient Education  2017 [employer].

## 2022-05-22 ENCOUNTER — Encounter: Payer: Medicare Other | Admitting: Family Medicine

## 2022-05-22 ENCOUNTER — Ambulatory Visit (INDEPENDENT_AMBULATORY_CARE_PROVIDER_SITE_OTHER): Payer: Medicare Other | Admitting: Family Medicine

## 2022-05-22 ENCOUNTER — Encounter: Payer: Self-pay | Admitting: Family Medicine

## 2022-05-22 VITALS — BP 132/60 | HR 83 | Temp 97.9°F | Ht 64.5 in | Wt 176.1 lb

## 2022-05-22 DIAGNOSIS — I152 Hypertension secondary to endocrine disorders: Secondary | ICD-10-CM | POA: Diagnosis not present

## 2022-05-22 DIAGNOSIS — F411 Generalized anxiety disorder: Secondary | ICD-10-CM | POA: Diagnosis not present

## 2022-05-22 DIAGNOSIS — E785 Hyperlipidemia, unspecified: Secondary | ICD-10-CM

## 2022-05-22 DIAGNOSIS — D638 Anemia in other chronic diseases classified elsewhere: Secondary | ICD-10-CM | POA: Diagnosis not present

## 2022-05-22 DIAGNOSIS — K743 Primary biliary cirrhosis: Secondary | ICD-10-CM

## 2022-05-22 DIAGNOSIS — E1159 Type 2 diabetes mellitus with other circulatory complications: Secondary | ICD-10-CM | POA: Diagnosis not present

## 2022-05-22 DIAGNOSIS — E1169 Type 2 diabetes mellitus with other specified complication: Secondary | ICD-10-CM

## 2022-05-22 DIAGNOSIS — Z5309 Procedure and treatment not carried out because of other contraindication: Secondary | ICD-10-CM | POA: Diagnosis not present

## 2022-05-22 LAB — HM DIABETES FOOT EXAM

## 2022-05-22 MED ORDER — ALPRAZOLAM 0.25 MG PO TABS: 0.2500 mg | ORAL_TABLET | Freq: Every day | ORAL | 0 refills | Status: DC | PRN

## 2022-05-22 NOTE — Assessment & Plan Note (Signed)
Chronic, stable control ? ?She has increased risk of cardiovascular disease but refuses statin given her PBC. ?

## 2022-05-22 NOTE — Progress Notes (Signed)
Patient ID: [name], female    DOB: [birth year], 82 y.o.   MRN: [id number]  This visit was conducted in person.  BP (!) 152/60   Pulse 83   Temp 97.9 F (36.6 C) (Temporal)   Ht 5' 4.5" (1.638 m)   Wt 176 lb 2 oz (79.9 kg)   SpO2 94%   BMI 29.76 kg/m    CC:  Chief Complaint  Patient presents with   Annual Exam    Part 2    Subjective:   HPI: [name] is a 82 y.o. female presenting on 05/22/2022 for Annual Exam (Part 2)  The patient presents for review of chronic health problems. He/She also has the following acute concerns today:     The patient saw a LPN or RN for medicare wellness visit. 05/21/22  Prevention and wellness was reviewed in detail. Note reviewed and important notes copied below.   No falls in last 12 months.  Flowsheet Row Clinical Support from 05/21/2022 in [facility] Total Score 0         Diabetes: At goal on metformin 500 mg twice daily Lab Results  Component Value Date   HGBA1C 6.5 05/16/2022  Using medications without difficulties: Hypoglycemic episodes: none Hyperglycemic episodes: none Feet problems: none Blood Sugars averaging: FBS 96 eye exam within last year: none  Hypertension:  Good control on HCTZ and lisinopril BP Readings from Last 3 Encounters:  05/22/22 (!) 152/60  03/06/22 138/68  08/02/21 116/66  Using medication without problems or lightheadedness: none Chest pain with exertion: none Edema:  occ Short of breath: none Average home BPs:  90-140/60s Other issues:  Elevated Cholesterol: LDL at goal less than 100, statin contraindicated given PBC Lab Results  Component Value Date   CHOL 176 05/16/2022   HDL 58.30 05/16/2022   LDLCALC 95 05/16/2022   TRIG 113.0 05/16/2022   CHOLHDL 3 05/16/2022  Using medications without problems: Muscle aches:  Diet compliance: moderate Exercise: walks as able/.. dog walking, 4 times a day.. 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  Followed by Dr. Huston Foley at [facility], followed  yearly.  GAD: Stable control on alprazolam. She is using 0.25 mg daily..  Usually uses daily.  She is not able to decrease the dose but also not escalating use. She refuses trial of SSRI as she has had side effects to Prozac in the past.    Reviewed OV from [facility] cardiology 11/09/21   Spinal stenosis treated by Dr. Regino Schultze with ESI. Tolerable control. Uses tens unit with relief. Tylenol for pain.   Vit Def: again low despite multivitamin.   Anemia... again low.... likely anemia of chronic disease.  Father with leukemia.  Iron panel nml in 2023. No bleeding.  Relevant past medical, surgical, family and social history reviewed and updated as indicated. Interim medical history since our last visit reviewed. Allergies and medications reviewed and updated. Outpatient Medications Prior to Visit  Medication Sig Dispense Refill   ALPRAZolam (XANAX) 0.25 MG tablet TAKE 1 TABLET BY MOUTH EVERY DAY AS NEEDED 30 tablet 0   glucose blood (FREESTYLE TEST STRIPS) test strip USE TO TEST BLOOD SUGAR ONCE DAILY 100 strip 3   hydrochlorothiazide (HYDRODIURIL) 25 MG tablet Take 1 tablet (25 mg total) by mouth daily. 90 tablet 3   Lancets (FREESTYLE) lancets USE TO CHECK BLOOD SUGAR DAILY AS DIRECTED 100 each 3   lisinopril (ZESTRIL) 10  MG tablet Take 1 tablet (10 mg total) by mouth 2 (two) times daily. 180 tablet 3   metFORMIN (GLUCOPHAGE) 500 MG tablet Take 1 tablet (500 mg total) by mouth 2 (two) times daily with a meal. 180 tablet 3   Multiple Vitamins-Minerals (CENTRUM SILVER 50+WOMEN) TABS Take 1 tablet by mouth daily.     Multiple Vitamins-Minerals (PRESERVISION AREDS 2 PO) Take by mouth.     ursodiol (ACTIGALL) 300 MG capsule Take 2 capsules by mouth in the morning and 1 capsule by mouth in the evening     Cyanocobalamin (VITAMIN B-12) 2500 MCG SUBL Place under the tongue. (Patient not taking: Reported on  05/21/2022)     No facility-administered medications prior to visit.     Per HPI unless specifically indicated in ROS section below Review of Systems  Constitutional:  Positive for fatigue. Negative for fever.  HENT:  Negative for ear pain.   Eyes:  Negative for pain.  Respiratory:  Negative for chest tightness and shortness of breath.   Cardiovascular:  Negative for chest pain, palpitations and leg swelling.  Gastrointestinal:  Negative for abdominal pain.  Genitourinary:  Negative for dysuria.  Musculoskeletal:  Positive for back pain.   Objective:  BP (!) 152/60   Pulse 83   Temp 97.9 F (36.6 C) (Temporal)   Ht 5' 4.5" (1.638 m)   Wt 176 lb 2 oz (79.9 kg)   SpO2 94%   BMI 29.76 kg/m   Wt Readings from Last 3 Encounters:  05/22/22 176 lb 2 oz (79.9 kg)  05/21/22 171 lb (77.6 kg)  03/06/22 180 lb (81.6 kg)      Physical Exam Constitutional:      General: She is not in acute distress.    Appearance: Normal appearance. She is well-developed. She is not ill-appearing or toxic-appearing.  HENT:     Head: Normocephalic.     Right Ear: Hearing, tympanic membrane, ear canal and external ear normal. Tympanic membrane is not erythematous, retracted or bulging.     Left Ear: Hearing, tympanic membrane, ear canal and external ear normal. Tympanic membrane is not erythematous, retracted or bulging.     Nose: No mucosal edema or rhinorrhea.     Right Sinus: No maxillary sinus tenderness or frontal sinus tenderness.     Left Sinus: No maxillary sinus tenderness or frontal sinus tenderness.     Mouth/Throat:     Pharynx: Uvula midline.  Eyes:     General: Lids are normal. Lids are everted, no foreign bodies appreciated.     Conjunctiva/sclera: Conjunctivae normal.     Pupils: Pupils are equal, round, and reactive to light.  Neck:     Thyroid: No thyroid mass or thyromegaly.     Vascular: No carotid bruit.     Trachea: Trachea normal.  Cardiovascular:     Rate and Rhythm:  Normal rate and regular rhythm.     Pulses: Normal pulses.     Heart sounds: Normal heart sounds, S1 normal and S2 normal. No murmur heard.    No friction rub. No gallop.  Pulmonary:     Effort: Pulmonary effort is normal. No tachypnea or respiratory distress.     Breath sounds: Normal breath sounds. No decreased breath sounds, wheezing, rhonchi or rales.  Abdominal:     General: Bowel sounds are normal.     Palpations: Abdomen is soft.     Tenderness: There is no abdominal tenderness.  Musculoskeletal:     Cervical back: Normal  range of motion and neck supple.  Skin:    General: Skin is warm and dry.     Findings: No rash.  Neurological:     Mental Status: She is alert.     Cranial Nerves: Cranial nerves 2-12 are intact.     Sensory: Sensation is intact.     Motor: Tremor present.     Comments: No cogwheeling, normal gait with normal arm swing.  No shuffling.  Psychiatric:        Mood and Affect: Mood is not anxious or depressed.        Speech: Speech normal.        Behavior: Behavior normal. Behavior is cooperative.        Thought Content: Thought content normal.        Judgment: Judgment normal.        Diabetic foot exam: Normal inspection No skin breakdown No calluses  Normal DP pulses Normal sensation to light touch and monofilament Nails normal  Results for orders placed or performed in visit on 05/22/22  HM DIABETES FOOT EXAM  Result Value Ref Range   HM Diabetic Foot Exam done     This visit occurred during the SARS-CoV-2 public health emergency.  Safety protocols were in place, including screening questions prior to the visit, additional usage of staff PPE, and extensive cleaning of exam room while observing appropriate contact time as indicated for disinfecting solutions.   COVID 19 screen:  No recent travel or known exposure to COVID19 The patient denies respiratory symptoms of COVID 19 at this time. The importance of social distancing was discussed today.    Assessment and Plan   The patient's preventative maintenance and recommended screening tests for an annual wellness exam were reviewed in full today. Brought up to date unless services declined.  Counselled on the importance of diet, exercise, and its role in overall health and mortality. The patient's FH and SH was reviewed, including their home life, tobacco status, and drug and alcohol status.   Vaccines:  Due for tdap but this is recommended against given liver issues, Uptodate with zostavax (not interested in shingrix), PNA and flu.  Has complete COVID vaccine x 3  Colon:   Brodie, 07/2004 colonoscopy, Neg stool  hemeoccult  2017,  No further needed. Mammo: 10/2017... she is not interested in continuing as she would not treat.  DEXA: allergic to calcium, overdue for eval.  She is not interested at this time, discussed again 2024.  Problem List Items Addressed This Visit     Anemia of chronic disease   Generalized anxiety disorder    Chronic, stable control  Using alprazolam low-dose 0.25 mg daily.  She is unable to wean off this.  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      Hypertension associated with diabetes - Primary (Chronic)    Stable, chronic.  Continue current medication.   lisinopril 10 mg daily Hydrochlorothiazide 25 mg daily      Primary biliary cholangitis (Chronic)    Chronic, stable  Followed by Dr. Huston Foley at [facility].  On Actigall 302 capsules by mouth in the morning and 1 capsule at night.      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 twice daily Encouraged exercise, weight  loss, healthy eating habits.       Kerby Nora, MD

## 2022-05-22 NOTE — Assessment & Plan Note (Signed)
Chronic, stable control ? ?Metformin 500 mg p.o. twice daily ?Encouraged exercise, weight loss, healthy eating habits. ? ?

## 2022-05-22 NOTE — Assessment & Plan Note (Signed)
Per hepatologist unable to take statin ?

## 2022-05-22 NOTE — Assessment & Plan Note (Signed)
Chronic, stable ? ?Followed by Dr. Brady at [facility].  On Actigall 302 capsules by mouth in the morning and 1 capsule at night. ?

## 2022-05-22 NOTE — Assessment & Plan Note (Signed)
Chronic, past iron panel nml., nml B12.

## 2022-05-22 NOTE — Assessment & Plan Note (Signed)
Stable, chronic.  Continue current medication. ? ? ?lisinopril 10 mg daily ?Hydrochlorothiazide 25 mg daily ?

## 2022-05-22 NOTE — Patient Instructions (Addendum)
Start OTC vit D 3 1000 IU daily  Try to limit use of alprazolam as able.

## 2022-05-22 NOTE — Assessment & Plan Note (Signed)
Chronic, stable control ? ?Using alprazolam low-dose 0.25 mg daily.  She is unable to wean off this.  She refuses SSRI treatment as she has had side effects to Prozac in the past ?

## 2022-05-23 DIAGNOSIS — L821 Other seborrheic keratosis: Secondary | ICD-10-CM | POA: Diagnosis not present

## 2022-05-23 DIAGNOSIS — L570 Actinic keratosis: Secondary | ICD-10-CM | POA: Diagnosis not present

## 2022-05-23 DIAGNOSIS — L820 Inflamed seborrheic keratosis: Secondary | ICD-10-CM | POA: Diagnosis not present

## 2022-05-23 DIAGNOSIS — Z85828 Personal history of other malignant neoplasm of skin: Secondary | ICD-10-CM | POA: Diagnosis not present

## 2022-07-09 DIAGNOSIS — K743 Primary biliary cirrhosis: Secondary | ICD-10-CM | POA: Diagnosis not present

## 2022-08-14 ENCOUNTER — Other Ambulatory Visit: Payer: Self-pay | Admitting: Family Medicine

## 2022-08-14 NOTE — Telephone Encounter (Signed)
Last office visit 05/22/2022 for CPE.  Last refilled 05/22/2022 for #100 with no refills.  Next appt: CPE 05/23/2023.

## 2022-09-25 DIAGNOSIS — M542 Cervicalgia: Secondary | ICD-10-CM | POA: Diagnosis not present

## 2022-10-08 DIAGNOSIS — Z23 Encounter for immunization: Secondary | ICD-10-CM | POA: Diagnosis not present

## 2022-10-17 DIAGNOSIS — L308 Other specified dermatitis: Secondary | ICD-10-CM | POA: Diagnosis not present

## 2022-10-17 DIAGNOSIS — Z85828 Personal history of other malignant neoplasm of skin: Secondary | ICD-10-CM | POA: Diagnosis not present

## 2022-10-22 DIAGNOSIS — H353221 Exudative age-related macular degeneration, left eye, with active choroidal neovascularization: Secondary | ICD-10-CM | POA: Diagnosis not present

## 2022-10-25 ENCOUNTER — Ambulatory Visit (INDEPENDENT_AMBULATORY_CARE_PROVIDER_SITE_OTHER): Payer: Medicare Other | Admitting: Family Medicine

## 2022-10-25 ENCOUNTER — Encounter: Payer: Self-pay | Admitting: Family Medicine

## 2022-10-25 VITALS — BP 118/70 | HR 73 | Temp 98.0°F | Ht 64.5 in | Wt 167.0 lb

## 2022-10-25 DIAGNOSIS — L98419 Non-pressure chronic ulcer of buttock with unspecified severity: Secondary | ICD-10-CM

## 2022-10-25 MED ORDER — MUPIROCIN CALCIUM 2 % EX CREA: 1.0000 | TOPICAL_CREAM | Freq: Two times a day (BID) | CUTANEOUS | 0 refills | Status: AC

## 2022-10-25 NOTE — Progress Notes (Signed)
Patient ID: [name], female    DOB: 03-01-1940, 82 y.o.   MRN: [id number]  This visit was conducted in person.  BP 118/70   Pulse 73   Temp 98 F (36.7 C) (Oral)   Ht 5' 4.5" (1.638 m)   Wt 167 lb (75.8 kg)   SpO2 99%   BMI 28.22 kg/m    CC:  Chief Complaint  Patient presents with   Skin Problem    C/o sore on R buttock  Noticed several mos ago. Followed by dermatology.     Subjective:   HPI: [name] is a 82 y.o. female presenting on 10/25/2022 for Skin Problem (C/o sore on R buttock  Noticed several mos ago. Followed by dermatology. )   She reports new onset  skin lesion on   Started as rough area, itchy. She applied cortisone cream.  Overtime increased in size.  Saw dermatologist.. told to use Duoderm and memory foam.  She feels this made it bleed and was more oozy. Cleaning with peroxide.  Area is sore, no odor,    Ongoing x  months.       Relevant past medical, surgical, family and social history reviewed and updated as indicated. Interim medical history since our last visit reviewed. Allergies and medications reviewed and updated. Outpatient Medications Prior to Visit  Medication Sig Dispense Refill   ALPRAZolam (XANAX) 0.25 MG tablet TAKE ONE TO TWO TABLETS BY MOUTH ONE TIME DAILY AS NEEDED 100 tablet 0   glucose blood (FREESTYLE TEST STRIPS) test strip USE TO TEST BLOOD SUGAR ONCE DAILY 100 strip 3   hydrochlorothiazide (HYDRODIURIL) 25 MG tablet Take 1 tablet (25 mg total) by mouth daily. 90 tablet 3   Lancets (FREESTYLE) lancets USE TO CHECK BLOOD SUGAR DAILY AS DIRECTED 100 each 3   lisinopril (ZESTRIL) 10 MG tablet Take 1 tablet (10 mg total) by mouth 2 (two) times daily. 180 tablet 3   metFORMIN (GLUCOPHAGE) 500 MG tablet Take 1 tablet (500 mg total) by mouth 2 (two) times daily with a meal. 180 tablet 3   Multiple Vitamins-Minerals (CENTRUM SILVER 50+WOMEN) TABS Take 1 tablet by mouth daily.     Multiple Vitamins-Minerals (PRESERVISION AREDS  2 PO) Take by mouth.     ursodiol (ACTIGALL) 300 MG capsule Take 2 capsules by mouth in the morning and 1 capsule by mouth in the evening     No facility-administered medications prior to visit.     Per HPI unless specifically indicated in ROS section below Review of Systems  Constitutional:  Negative for fatigue and fever.  HENT:  Negative for congestion.   Eyes:  Negative for pain.  Respiratory:  Negative for cough and shortness of breath.   Cardiovascular:  Negative for chest pain, palpitations and leg swelling.  Gastrointestinal:  Negative for abdominal pain.  Genitourinary:  Negative for dysuria and vaginal bleeding.  Musculoskeletal:  Negative for back pain.  Neurological:  Negative for syncope, light-headedness and headaches.  Psychiatric/Behavioral:  Negative for dysphoric mood.    Objective:  BP 118/70   Pulse 73   Temp 98 F (36.7 C) (Oral)   Ht 5' 4.5" (1.638 m)   Wt 167 lb (75.8 kg)   SpO2 99%   BMI 28.22 kg/m   Wt Readings from Last 3 Encounters:  10/25/22 167 lb (75.8 kg)  05/22/22 176 lb 2 oz (79.9 kg)  05/21/22 171 lb (77.6 kg)      Physical Exam Constitutional:  General: She is not in acute distress.    Appearance: Normal appearance. She is well-developed. She is not ill-appearing or toxic-appearing.  HENT:     Head: Normocephalic.     Right Ear: Hearing, tympanic membrane, ear canal and external ear normal. Tympanic membrane is not erythematous, retracted or bulging.     Left Ear: Hearing, tympanic membrane, ear canal and external ear normal. Tympanic membrane is not erythematous, retracted or bulging.     Nose: No mucosal edema or rhinorrhea.     Right Sinus: No maxillary sinus tenderness or frontal sinus tenderness.     Left Sinus: No maxillary sinus tenderness or frontal sinus tenderness.     Mouth/Throat:     Mouth: Oropharynx is clear and moist and mucous membranes are normal.     Pharynx: Uvula midline.  Eyes:     General: Lids are normal.  Lids are everted, no foreign bodies appreciated.     Extraocular Movements: EOM normal.     Conjunctiva/sclera: Conjunctivae normal.     Pupils: Pupils are equal, round, and reactive to light.  Neck:     Thyroid: No thyroid mass or thyromegaly.     Vascular: No carotid bruit.     Trachea: Trachea normal.  Cardiovascular:     Rate and Rhythm: Normal rate and regular rhythm.     Pulses: Normal pulses.     Heart sounds: Normal heart sounds, S1 normal and S2 normal. No murmur heard.    No friction rub. No gallop.  Pulmonary:     Effort: Pulmonary effort is normal. No tachypnea or respiratory distress.     Breath sounds: Normal breath sounds. No decreased breath sounds, wheezing, rhonchi or rales.  Abdominal:     General: Bowel sounds are normal.     Palpations: Abdomen is soft.     Tenderness: There is no abdominal tenderness.  Musculoskeletal:     Cervical back: Normal range of motion and neck supple.  Skin:    General: Skin is warm, dry and intact.     Findings: No rash.  Neurological:     Mental Status: She is alert.  Psychiatric:        Mood and Affect: Mood is not anxious or depressed.        Speech: Speech normal.        Behavior: Behavior normal. Behavior is cooperative.        Thought Content: Thought content normal.        Cognition and Memory: Cognition and memory normal.        Judgment: Judgment normal.       Results for orders placed or performed in visit on 05/22/22  HM DIABETES FOOT EXAM  Result Value Ref Range   HM Diabetic Foot Exam done     Assessment and Plan  There are no diagnoses linked to this encounter.  No follow-ups on file.   Kerby Nora, MD

## 2022-10-25 NOTE — Patient Instructions (Addendum)
Can use topical mupirocin cream daily on area.  Avoid pressure on that site.  If DuoDERM patches did not feel good for you at least keep  absorbable nonstick dressing on the lesion. We will let you know what the viral culture looks like.

## 2022-10-25 NOTE — Assessment & Plan Note (Signed)
Acute, most likely decubitus ulcer but will evaluate for HSV with viral culture.  Given increase in discharge and slight erythema around ulcer will treat with topical mupirocin ointment once daily.  Discussed the benefit of hydrocolloid dressing but she did not like using this for this area.  She will continue with absorbable nonstick dressing and will rotate and limit pressure on that site.

## 2022-10-29 DIAGNOSIS — Z961 Presence of intraocular lens: Secondary | ICD-10-CM | POA: Diagnosis not present

## 2022-10-29 DIAGNOSIS — H353221 Exudative age-related macular degeneration, left eye, with active choroidal neovascularization: Secondary | ICD-10-CM | POA: Diagnosis not present

## 2022-10-29 DIAGNOSIS — H353131 Nonexudative age-related macular degeneration, bilateral, early dry stage: Secondary | ICD-10-CM | POA: Diagnosis not present

## 2022-10-31 ENCOUNTER — Telehealth: Payer: Self-pay | Admitting: Family Medicine

## 2022-10-31 LAB — TIQ-MISC

## 2022-10-31 NOTE — Telephone Encounter (Signed)
Pt called stating she received a mychart message about completing a questionnaire regarding the most recent test done by Dr. Ermalene Searing. Pt states she's confused how to respond to or comp the questionnaire. Pt also mentioned she believes mupirocin cream (BACTROBAN) 2 % is making the spot on her bottom worse. Call back # [phone]

## 2022-11-01 NOTE — Telephone Encounter (Signed)
She does not have to complete the questionnaire as far as I am concerned. Have her stop the mupirocin ointment.  Just keep the area clean and dry.  Avoid pressure on the area.  we are still awaiting the HSV culture.

## 2022-11-02 LAB — HERPES SIMPLEX VIRUS CULTURE W/RFLX TO TYPING

## 2022-11-02 LAB — VIRAL CULTURE VIRC

## 2022-11-02 NOTE — Telephone Encounter (Signed)
Patient called in regarding this. Informed her of Dr. Ermalene Searing response below.

## 2022-11-07 ENCOUNTER — Ambulatory Visit: Payer: Medicare Other | Admitting: Internal Medicine

## 2022-11-08 ENCOUNTER — Other Ambulatory Visit: Payer: Self-pay | Admitting: Family Medicine

## 2022-11-08 NOTE — Telephone Encounter (Signed)
Last refill: 08/14/22 #100 W/ 0 refiils LOV: 10/25/22 (Acute visit- for ulcer) NOV: 05/23/23

## 2022-12-10 DIAGNOSIS — H353221 Exudative age-related macular degeneration, left eye, with active choroidal neovascularization: Secondary | ICD-10-CM | POA: Diagnosis not present

## 2023-01-08 DIAGNOSIS — H353221 Exudative age-related macular degeneration, left eye, with active choroidal neovascularization: Secondary | ICD-10-CM | POA: Diagnosis not present

## 2023-01-25 ENCOUNTER — Other Ambulatory Visit: Payer: Self-pay | Admitting: Family Medicine

## 2023-01-25 NOTE — Telephone Encounter (Signed)
Last office visit 10/25/2022 for Ulcer of buttock.  Last refilled 11/08/2022 for #100 with no refills.  Next Appt: CPE 05/23/2023.

## 2023-02-22 DIAGNOSIS — H353221 Exudative age-related macular degeneration, left eye, with active choroidal neovascularization: Secondary | ICD-10-CM | POA: Diagnosis not present

## 2023-03-22 ENCOUNTER — Other Ambulatory Visit: Payer: Self-pay | Admitting: Family Medicine

## 2023-03-22 DIAGNOSIS — E1159 Type 2 diabetes mellitus with other circulatory complications: Secondary | ICD-10-CM

## 2023-03-22 NOTE — Telephone Encounter (Signed)
Copied from CRM [id number]. Topic: Clinical - Medication Refill >> Mar 22, 2023  2:48 PM Alcus Dad wrote: Most Recent Primary Care Visit:  Provider: Kerby Nora E  Department: [facility]  Visit Type: OFFICE VISIT  Date: 10/25/2022  Medication: ALPRAZolam (XANAX) 0.25 MG tablet glucose blood (FREESTYLE TEST STRIPS) test strip hydrochlorothiazide (HYDRODIURIL) 25 MG tablet Lancets (FREESTYLE) lancets lisinopril (ZESTRIL) 10 MG tablet metFORMIN (GLUCOPHAGE) 500 MG tablet Multiple Vitamins-Minerals (CENTRUM SILVER 50+WOMEN) TABS Multiple Vitamins-Minerals (PRESERVISION AREDS 2 PO) mupirocin cream (BACTROBAN) 2 % ursodiol (ACTIGALL) 300 MG capsule    Has the patient contacted their pharmacy? Yes (Agent: If no, request that the patient contact the pharmacy for the refill. If patient does not wish to contact the pharmacy document the reason why and proceed with request.) (Agent: If yes, when and what did the pharmacy advise?)  Is this the correct pharmacy for this prescription? Yes If no, delete pharmacy and type the correct one.  This is the patient's preferred pharmacy:  [facility] [id number]  [place], [place] - [address] [phone]  Has the prescription been filled recently? Yes  Is the patient out of the medication? Almost  Has the patient been seen for an appointment in the last year OR does the patient have an upcoming appointment? Yes  Can we respond through MyChart? Yes  Agent: Please be advised that Rx refills may take up to 3 business days. We ask that you follow-up with your pharmacy.

## 2023-04-01 ENCOUNTER — Other Ambulatory Visit: Payer: Self-pay | Admitting: Family Medicine

## 2023-04-01 MED ORDER — HYDROCHLOROTHIAZIDE 25 MG PO TABS: 25.0000 mg | ORAL_TABLET | Freq: Every day | ORAL | 0 refills | Status: DC

## 2023-04-01 MED ORDER — METFORMIN HCL 500 MG PO TABS: 500.0000 mg | ORAL_TABLET | Freq: Two times a day (BID) | ORAL | 0 refills | Status: DC

## 2023-04-01 MED ORDER — LISINOPRIL 10 MG PO TABS: 10.0000 mg | ORAL_TABLET | Freq: Two times a day (BID) | ORAL | 0 refills | Status: DC

## 2023-04-01 NOTE — Telephone Encounter (Signed)
 Last Fill: Alprazolam: 01/28/23 100 tabs/0 RF     Hydrochlorothiazide: 01/25/23     Lisinopril: 01/25/23     Metformin: 01/25/23  Last OV: 10/25/22 Next OV: 05/22/23 AWV  Routing to provider for review/authorization.

## 2023-04-01 NOTE — Telephone Encounter (Signed)
 Last office visit 09/26/204 for buttock ulcer.  Last refilled 01/28/2023 for #100 with no refills.  Next Appt: CPE 05/23/2023.

## 2023-04-01 NOTE — Telephone Encounter (Signed)
 Copied from CRM [id number]. Topic: Clinical - Medication Refill >> Apr 01, 2023 10:49 AM Orinda Kenner C wrote: Most Recent Primary Care Visit:  Provider: Kerby Nora E  Department: [facility]  Visit Type: OFFICE VISIT  Date: 10/25/2022  Medication: ALPRAZolam (XANAX) 0.25 MG tablet, hydrochlorothiazide (HYDRODIURIL) 25 MG tablet, lisinopril (ZESTRIL) 10 MG tablet, metFORMIN (GLUCOPHAGE) 500 MG tablet.   Has the patient contacted their pharmacy? Yes (Agent: If no, request that the patient contact the pharmacy for the refill. If patient does not wish to contact the pharmacy document the reason why and proceed with request.) (Agent: If yes, when and what did the pharmacy advise?)  Is this the correct pharmacy for this prescription? Yes If no, delete pharmacy and type the correct one.  This is the patient's preferred pharmacy:  [facility] [id number] [place], [place] - [address] [place] [address] Phone: [phone] Fax: [phone]   Has the prescription been filled recently? No  Is the patient out of the medication? Yes. Patient is upset that she's been out of medications for a week and needs medications filled today.   Has the patient been seen for an appointment in the last year OR does the patient have an upcoming appointment? Yes  Can we respond through MyChart? No, please [phone]  Agent: Please be advised that Rx refills may take up to 3 business days. We ask that you follow-up with your pharmacy.

## 2023-04-03 MED ORDER — ALPRAZOLAM 0.25 MG PO TABS: ORAL_TABLET | ORAL | 0 refills | Status: DC

## 2023-04-16 DIAGNOSIS — H353221 Exudative age-related macular degeneration, left eye, with active choroidal neovascularization: Secondary | ICD-10-CM | POA: Diagnosis not present

## 2023-04-16 LAB — HM DIABETES EYE EXAM

## 2023-04-18 DIAGNOSIS — I10 Essential (primary) hypertension: Secondary | ICD-10-CM | POA: Diagnosis not present

## 2023-04-18 DIAGNOSIS — R55 Syncope and collapse: Secondary | ICD-10-CM | POA: Diagnosis not present

## 2023-04-18 DIAGNOSIS — Z9189 Other specified personal risk factors, not elsewhere classified: Secondary | ICD-10-CM | POA: Diagnosis not present

## 2023-04-20 ENCOUNTER — Other Ambulatory Visit: Payer: Self-pay | Admitting: Family Medicine

## 2023-04-29 ENCOUNTER — Telehealth: Payer: Self-pay | Admitting: *Deleted

## 2023-04-29 DIAGNOSIS — E559 Vitamin D deficiency, unspecified: Secondary | ICD-10-CM

## 2023-04-29 DIAGNOSIS — E1159 Type 2 diabetes mellitus with other circulatory complications: Secondary | ICD-10-CM

## 2023-04-29 DIAGNOSIS — E538 Deficiency of other specified B group vitamins: Secondary | ICD-10-CM

## 2023-04-29 DIAGNOSIS — E1169 Type 2 diabetes mellitus with other specified complication: Secondary | ICD-10-CM

## 2023-04-29 DIAGNOSIS — D638 Anemia in other chronic diseases classified elsewhere: Secondary | ICD-10-CM

## 2023-04-29 NOTE — Telephone Encounter (Signed)
-----   Message from Alvina Chou sent at 04/29/2023  3:36 PM EDT ----- Regarding: Lab orders for [facility], 4.17.25 Patient is scheduled for CPX labs, please order future labs, Thanks , Camelia Eng

## 2023-05-16 ENCOUNTER — Other Ambulatory Visit (INDEPENDENT_AMBULATORY_CARE_PROVIDER_SITE_OTHER): Payer: Medicare Other

## 2023-05-16 ENCOUNTER — Encounter: Payer: Self-pay | Admitting: Family Medicine

## 2023-05-16 DIAGNOSIS — E1169 Type 2 diabetes mellitus with other specified complication: Secondary | ICD-10-CM | POA: Diagnosis not present

## 2023-05-16 DIAGNOSIS — E1159 Type 2 diabetes mellitus with other circulatory complications: Secondary | ICD-10-CM | POA: Diagnosis not present

## 2023-05-16 DIAGNOSIS — E538 Deficiency of other specified B group vitamins: Secondary | ICD-10-CM

## 2023-05-16 DIAGNOSIS — E785 Hyperlipidemia, unspecified: Secondary | ICD-10-CM | POA: Diagnosis not present

## 2023-05-16 DIAGNOSIS — E559 Vitamin D deficiency, unspecified: Secondary | ICD-10-CM | POA: Diagnosis not present

## 2023-05-16 DIAGNOSIS — D638 Anemia in other chronic diseases classified elsewhere: Secondary | ICD-10-CM

## 2023-05-16 LAB — CBC WITH DIFFERENTIAL/PLATELET
Basophils Absolute: 0.1 10*3/uL (ref 0.0–0.1)
Basophils Relative: 0.9 % (ref 0.0–3.0)
Eosinophils Absolute: 0.2 10*3/uL (ref 0.0–0.7)
Eosinophils Relative: 3.5 % (ref 0.0–5.0)
HCT: 32.8 % — ABNORMAL LOW (ref 36.0–46.0)
Hemoglobin: 10.9 g/dL — ABNORMAL LOW (ref 12.0–15.0)
Lymphocytes Relative: 24.7 % (ref 12.0–46.0)
Lymphs Abs: 1.5 10*3/uL (ref 0.7–4.0)
MCHC: 33 g/dL (ref 30.0–36.0)
MCV: 87.6 fl (ref 78.0–100.0)
Monocytes Absolute: 0.6 10*3/uL (ref 0.1–1.0)
Monocytes Relative: 9.8 % (ref 3.0–12.0)
Neutro Abs: 3.8 10*3/uL (ref 1.4–7.7)
Neutrophils Relative %: 61.1 % (ref 43.0–77.0)
Platelets: 353 10*3/uL (ref 150.0–400.0)
RBC: 3.75 Mil/uL — ABNORMAL LOW (ref 3.87–5.11)
RDW: 13 % (ref 11.5–15.5)
WBC: 6.2 10*3/uL (ref 4.0–10.5)

## 2023-05-16 LAB — MICROALBUMIN / CREATININE URINE RATIO
Creatinine,U: 61.9 mg/dL
Microalb Creat Ratio: UNDETERMINED mg/g (ref 0.0–30.0)
Microalb, Ur: <0.7 mg/dL

## 2023-05-16 LAB — COMPREHENSIVE METABOLIC PANEL WITH GFR
ALT: 10 U/L (ref 0–35)
AST: 19 U/L (ref 0–37)
Albumin: 4.4 g/dL (ref 3.5–5.2)
Alkaline Phosphatase: 78 U/L (ref 39–117)
BUN: 36 mg/dL — ABNORMAL HIGH (ref 6–23)
CO2: 26 meq/L (ref 19–32)
Calcium: 9.7 mg/dL (ref 8.4–10.5)
Chloride: 102 meq/L (ref 96–112)
Creatinine, Ser: 1.23 mg/dL — ABNORMAL HIGH (ref 0.40–1.20)
GFR: 41 mL/min — ABNORMAL LOW (ref >60.00)
Glucose, Bld: 110 mg/dL — ABNORMAL HIGH (ref 70–99)
Potassium: 4.5 meq/L (ref 3.5–5.1)
Sodium: 136 meq/L (ref 135–145)
Total Bilirubin: 0.5 mg/dL (ref 0.2–1.2)
Total Protein: 7.2 g/dL (ref 6.0–8.3)

## 2023-05-16 LAB — HEMOGLOBIN A1C: Hgb A1c MFr Bld: 6.4 % (ref 4.6–6.5)

## 2023-05-16 LAB — LIPID PANEL
Cholesterol: 184 mg/dL (ref 0–200)
HDL: 57.1 mg/dL (ref >39.00)
LDL Cholesterol: 109 mg/dL — ABNORMAL HIGH (ref 0–99)
NonHDL: 126.97
Total CHOL/HDL Ratio: 3
Triglycerides: 91 mg/dL (ref 0.0–149.0)
VLDL: 18.2 mg/dL (ref 0.0–40.0)

## 2023-05-16 LAB — VITAMIN D 25 HYDROXY (VIT D DEFICIENCY, FRACTURES): VITD: 31.64 ng/mL (ref 30.00–100.00)

## 2023-05-16 LAB — VITAMIN B12: Vitamin B-12: 342 pg/mL (ref 211–911)

## 2023-05-16 NOTE — Progress Notes (Signed)
 No critical labs need to be addressed urgently. We will discuss labs in detail at upcoming office visit.

## 2023-05-22 ENCOUNTER — Ambulatory Visit (INDEPENDENT_AMBULATORY_CARE_PROVIDER_SITE_OTHER): Payer: Medicare Other

## 2023-05-22 VITALS — Ht 64.5 in | Wt 167.0 lb

## 2023-05-22 DIAGNOSIS — Z0000 Encounter for general adult medical examination without abnormal findings: Secondary | ICD-10-CM | POA: Diagnosis not present

## 2023-05-22 NOTE — Patient Instructions (Addendum)
 Ms. Garman , Thank you for taking time to come for your Medicare Wellness Visit. I appreciate your ongoing commitment to your health goals. Please review the following plan we discussed and let me know if I can assist you in the future.   Referrals/Orders/Follow-Ups/Clinician Recommendations: none  This is a list of the screening recommended for you and due dates:  Health Maintenance  Topic Date Due   Zoster (Shingles) Vaccine (1 of 2) [birth year]/15/1960   DTaP/Tdap/Td vaccine (2 - Tdap) 10/29/2013   Eye exam for diabetics  08/30/2022   COVID-19 Vaccine (4 - 2024-25 season) 09/30/2022   Complete foot exam   05/22/2023   DEXA scan (bone density measurement)  05/22/2023*   Flu Shot  08/30/2023   Hemoglobin A1C  11/15/2023   Yearly kidney function blood test for diabetes  05/15/2024   Yearly kidney health urinalysis for diabetes  05/15/2024   Medicare Annual Wellness Visit  05/21/2024   Pneumonia Vaccine  Completed   HPV Vaccine  Aged Out   Meningitis B Vaccine  Aged Out   Hepatitis C Screening  Discontinued  *Topic was postponed. The date shown is not the original due date.    Advanced directives: (Copy Requested) Please bring a copy of your health care power of attorney and living will to the office to be added to your chart at your convenience. You can mail to [facility] [address] [place], [place] [address] or email to [email or web address]  Pt says she has submitted this to [facility] and [facility]  Next Medicare Annual Wellness Visit scheduled for next year: Yes 05/22/24 @ 1:40pm televisit

## 2023-05-22 NOTE — Progress Notes (Signed)
 Subjective:   [name] is a 83 y.o. who presents for a Medicare Wellness preventive visit.  Visit Complete: Virtual I connected with  [name] on 05/22/23 by a audio enabled telemedicine application and verified that I am speaking with the correct person using two identifiers.  Patient Location: Home  Provider Location: Home Office  I discussed the limitations of evaluation and management by telemedicine. The patient expressed understanding and agreed to proceed.  Vital Signs: Because this visit was a virtual/telehealth visit, some criteria may be missing or patient reported. Any vitals not documented were not able to be obtained and vitals that have been documented are patient reported.  VideoDeclined- This patient declined Librarian, academic. Therefore the visit was completed with audio only.  Persons Participating in Visit: Patient.  AWV Questionnaire: No: Patient Medicare AWV questionnaire was not completed prior to this visit.  Cardiac Risk Factors include: advanced age (>42men, >71 women);diabetes mellitus;dyslipidemia;hypertension     Objective:    Today's Vitals   05/22/23 1341  Weight: 167 lb (75.8 kg)  Height: 5' 4.5" (1.638 m)   Body mass index is 28.22 kg/m.     05/22/2023    2:03 PM 05/21/2022   11:51 AM 09/19/2020    9:01 AM 06/06/2018    9:47 AM 09/15/2014   11:05 AM  Advanced Directives  Does Patient Have a Medical Advance Directive? Yes Yes Yes Yes Yes  Type of Estate agent of [place];Living will Healthcare Power of [place];Living will Healthcare Power of [place];Out of facility DNR (pink MOST or yellow form);Living will Healthcare Power of [place];Living will Healthcare Power of [place];Living will  Does patient want to make changes to medical advance directive?     No - Patient declined  Copy of Healthcare Power of Attorney in Chart? No - copy requested No - copy requested  No - copy requested  No - copy requested    Current Medications (verified) Outpatient Encounter Medications as of 05/22/2023  Medication Sig   ALPRAZolam  (XANAX ) 0.25 MG tablet TAKE ONE TO TWO TABLETS BY MOUTH ONE TIME DAILY AS NEEDEDTAKE ONE TO TWO TABLETS BY MOUTH ONE TIME DAILY AS NEEDED   glucose blood (FREESTYLE TEST STRIPS) test strip USE TO TEST BLOOD SUGAR ONCE DAILY   hydrochlorothiazide  (HYDRODIURIL ) 25 MG tablet Take 1 tablet (25 mg total) by mouth daily.   Lancets (FREESTYLE) lancets USE TO CHECK BLOOD SUGAR DAILY AS DIRECTED   lisinopril  (ZESTRIL ) 10 MG tablet Take 1 tablet (10 mg total) by mouth 2 (two) times daily.   metFORMIN  (GLUCOPHAGE ) 500 MG tablet Take 1 tablet (500 mg total) by mouth 2 (two) times daily with a meal. TAKE ONE TABLET BY MOUTH TWICE A DAY WITH A MEAL   Multiple Vitamins-Minerals (CENTRUM SILVER 50+WOMEN) TABS Take 1 tablet by mouth daily.   Multiple Vitamins-Minerals (PRESERVISION AREDS 2 PO) Take by mouth.   mupirocin  cream (BACTROBAN ) 2 % Apply 1 Application topically 2 (two) times daily.   ursodiol (ACTIGALL) 300 MG capsule Take 2 capsules by mouth in the morning and 1 capsule by mouth in the evening   No facility-administered encounter medications on file as of 05/22/2023.    Allergies (verified) Amoxicillin-pot clavulanate, Codeine, Demerol [meperidine], Talwin [pentazocine], Tessalon [benzonatate], Vicodin [hydrocodone-acetaminophen], Betadine [povidone iodine], Tape, Calcium  channel blockers, Iodine, Neosporin [neomycin-bacitracin zn-polymyx], and Sulfa antibiotics   History: Past Medical History:  Diagnosis Date   Diabetes mellitus (HCC)    Environmental allergies  Hypertension    Liver failure (HCC)    Pancreatitis    Past Surgical History:  Procedure Laterality Date   CARPAL TUNNEL RELEASE  8/91   CHOLECYSTECTOMY  10/80   COLONOSCOPY     ERCP  3/95   fusion on fingers  2000 2001   INCONTINENCE SURGERY  09/17/00   KNEE ARTHROPLASTY  10/25/95   x2   KNEE  ARTHROSCOPY  7/92, 93, 94   x3   LIVER BIOPSY  5/95   perforated colon  3/07   SHOULDER ARTHROSCOPY  3/92   spur cut muscle   sigmoid colon surgery  07/17/05   Family History  Problem Relation Age of Onset   Colon cancer Neg Hx    Colon polyps Neg Hx    Social History   Socioeconomic History   Marital status: Married    Spouse name: Merion Caton   Number of children: 2   Years of education: Not on file   Highest education level: Not on file  Occupational History   Not on file  Tobacco Use   Smoking status: Never    Passive exposure: Past (as a child)   Smokeless tobacco: Never  Substance and Sexual Activity   Alcohol use: Yes    Alcohol/week: 0.0 standard drinks of alcohol    Comment: 4oz wine 2 times yearly   Drug use: No   Sexual activity: Not on file  Other Topics Concern   Not on file  Social History Narrative   Right Handed    Lives in a one story home   Social Drivers of Health   Financial Resource Strain: Low Risk  (05/22/2023)   Overall Financial Resource Strain (CARDIA)    Difficulty of Paying Living Expenses: Not hard at all  Food Insecurity: No Food Insecurity (05/22/2023)   Hunger Vital Sign    Worried About Running Out of Food in the Last Year: Never true    Ran Out of Food in the Last Year: Never true  Transportation Needs: No Transportation Needs (05/22/2023)   PRAPARE - Administrator, Civil Service (Medical): No    Lack of Transportation (Non-Medical): No  Physical Activity: Insufficiently Active (05/22/2023)   Exercise Vital Sign    Days of Exercise per Week: 3 days    Minutes of Exercise per Session: 30 min  Stress: No Stress Concern Present (05/22/2023)   [employer] of Occupational Health - Occupational Stress Questionnaire    Feeling of Stress : Not at all  Social Connections: Moderately Integrated (05/22/2023)   Social Connection and Isolation Panel [NHANES]    Frequency of Communication with Friends and Family: More than  three times a week    Frequency of Social Gatherings with Friends and Family: More than three times a week    Attends Religious Services: Never    Database administrator or Organizations: Yes    Attends Engineer, structural: 1 to 4 times per year    Marital Status: Married    Tobacco Counseling Counseling given: Not Answered    Clinical Intake:  Pre-visit preparation completed: Yes  Pain : No/denies pain     BMI - recorded: 28.22 Nutritional Status: BMI 25 -29 Overweight Nutritional Risks: None Diabetes: Yes CBG done?: Yes (BS 92 this am at home) CBG resulted in Enter/ Edit results?: No Did pt. bring in CBG monitor from home?: No  Lab Results  Component Value Date   HGBA1C 6.4 05/16/2023   HGBA1C 6.5  05/16/2022   HGBA1C 6.6 (H) 10/06/2021     How often do you need to have someone help you when you read instructions, pamphlets, or other written materials from your doctor or pharmacy?: 1 - Never  Interpreter Needed?: No  Comments: lives with husband and dog Information entered by :: B.Kallon Caylor,LPN   Activities of Daily Living     05/22/2023    2:03 PM  In your present state of health, do you have any difficulty performing the following activities:  Hearing? 0  Vision? 1  Difficulty concentrating or making decisions? 0  Walking or climbing stairs? 0  Dressing or bathing? 0  Doing errands, shopping? 0  Preparing Food and eating ? N  Using the Toilet? N  In the past six months, have you accidently leaked urine? N  Do you have problems with loss of bowel control? N  Managing your Medications? N  Managing your Finances? N  Housekeeping or managing your Housekeeping? N    Patient Care Team: Judithann Novas, MD as PCP - General (Family Medicine) Alta Ast, [facility] (Inactive) as Pharmacist (Pharmacist) Patel, Donika K, DO as Consulting Physician (Neurology) Dingeldein, Landon Pinion, MD (Ophthalmology)  Indicate any recent Medical Services you may have  received from other than [facility] providers in the past year (date may be approximate).     Assessment:   This is a routine wellness examination for [name].  Hearing/Vision screen Hearing Screening - Comments:: Pt says her hearing is good Vision Screening - Comments:: Pt says eye    Goals Addressed   None    Depression Screen     05/22/2023    1:47 PM 10/25/2022   11:15 AM 05/21/2022   11:48 AM 05/21/2022   11:47 AM 05/19/2021   10:34 AM 05/17/2020   10:14 AM 05/14/2019   10:26 AM  PHQ 2/9 Scores  PHQ - 2 Score 0 0 0 0 0 0 0  PHQ- 9 Score     0      Fall Risk     05/22/2023    1:44 PM 10/25/2022   11:14 AM 05/21/2022   11:51 AM 05/19/2021   10:33 AM 05/19/2021   10:29 AM  Fall Risk   Falls in the past year? 0 0 0 0 0  Number falls in past yr: 0  0 0 0  Injury with Fall? 0  0 0 0  Risk for fall due to : No Fall Risks  No Fall Risks No Fall Risks No Fall Risks  Follow up Education provided;Falls prevention discussed  Falls prevention discussed;Falls evaluation completed;Education provided Falls evaluation completed Falls evaluation completed    MEDICARE RISK AT HOME:  Medicare Risk at Home Any stairs in or around the home?: Yes If so, are there any without handrails?: Yes Home free of loose throw rugs in walkways, pet beds, electrical cords, etc?: Yes Adequate lighting in your home to reduce risk of falls?: Yes Life alert?: No Use of a cane, walker or w/c?: Yes (cane) Grab bars in the bathroom?: Yes Shower chair or bench in shower?: Yes Elevated toilet seat or a handicapped toilet?: Yes  TIMED UP AND GO:  Was the test performed?  No  Cognitive Function: 6CIT completed    06/06/2018    9:58 AM  MMSE - Mini Mental State Exam  Orientation to time 5  Orientation to Place 5  Registration 3  Attention/ Calculation 0  Recall 3  Language- name 2 objects 0  Language- repeat  1  Language- follow 3 step command 0  Language- read & follow direction 0  Write a sentence 0  Copy  design 0  Total score 17        05/22/2023    2:04 PM 05/21/2022   11:54 AM  6CIT Screen  What Year? 0 points 0 points  What month? 0 points 0 points  What time? 0 points 0 points  Count back from 20 0 points 0 points  Months in reverse 0 points 0 points  Repeat phrase 0 points 0 points  Total Score 0 points 0 points    Immunizations Immunization History  Administered Date(s) Administered   Fluad Quad(high Dose 65+) 10/12/2022   Influenza, High Dose Seasonal PF 10/01/2014, 10/18/2016, 10/25/2017   Influenza-Unspecified 10/09/2013   PFIZER(Purple Top)SARS-COV-2 Vaccination 02/19/2019, 03/13/2019, 11/06/2019   Pneumococcal Conjugate-13 04/21/2014   Pneumococcal Polysaccharide-23 05/15/2016   Td 10/30/2003   Zoster, Live 04/30/2006    Screening Tests Health Maintenance  Topic Date Due   Zoster Vaccines- Shingrix (1 of 2) 02/15/1960   DTaP/Tdap/Td (2 - Tdap) 10/29/2013   OPHTHALMOLOGY EXAM  08/30/2022   COVID-19 Vaccine (4 - 2024-25 season) 09/30/2022   FOOT EXAM  05/22/2023   DEXA SCAN  05/22/2023 (Originally 02/14/2006)   INFLUENZA VACCINE  08/30/2023   HEMOGLOBIN A1C  11/15/2023   Diabetic kidney evaluation - eGFR measurement  05/15/2024   Diabetic kidney evaluation - Urine ACR  05/15/2024   Medicare Annual Wellness (AWV)  05/21/2024   Pneumonia Vaccine 42+ Years old  Completed   HPV VACCINES  Aged Out   Meningococcal B Vaccine  Aged Out   Hepatitis C Screening  Discontinued   Health Maintenance  Health Maintenance Due  Topic Date Due   Zoster Vaccines- Shingrix (1 of 2) 02/15/1960   DTaP/Tdap/Td (2 - Tdap) 10/29/2013   OPHTHALMOLOGY EXAM  08/30/2022   COVID-19 Vaccine (4 - 2024-25 season) 09/30/2022   FOOT EXAM  05/22/2023   Health Maintenance Items Addressed: None needed  Additional Screening:  Vision Screening: Recommended annual ophthalmology exams for early detection of glaucoma and other disorders of the eye.  Dental Screening: Recommended annual  dental exams for proper oral hygiene  Community Resource Referral / Chronic Care Management: CRR required this visit?  No   CCM required this visit?  No    Plan:     I have personally reviewed and noted the following in the patient's chart:   Medical and social history Use of alcohol, tobacco or illicit drugs  Current medications and supplements including opioid prescriptions. Patient is not currently taking opioid prescriptions. Functional ability and status Nutritional status Physical activity Advanced directives List of other physicians Hospitalizations, surgeries, and ER visits in previous 12 months Vitals Screenings to include cognitive, depression, and falls Referrals and appointments  In addition, I have reviewed and discussed with patient certain preventive protocols, quality metrics, and best practice recommendations. A written personalized care plan for preventive services as well as general preventive health recommendations were provided to patient.   Nerissa Bannister, LPN   03/14/863   After Visit Summary: (MyChart) Due to this being a telephonic visit, the after visit summary with patients personalized plan was offered to patient via MyChart   Notes: Nothing significant to report at this time.

## 2023-05-23 ENCOUNTER — Ambulatory Visit: Payer: Medicare Other | Admitting: Family Medicine

## 2023-05-23 ENCOUNTER — Encounter: Payer: Self-pay | Admitting: Family Medicine

## 2023-05-23 VITALS — BP 134/68 | HR 79 | Temp 99.0°F | Ht 64.0 in | Wt 174.1 lb

## 2023-05-23 DIAGNOSIS — D638 Anemia in other chronic diseases classified elsewhere: Secondary | ICD-10-CM

## 2023-05-23 DIAGNOSIS — E785 Hyperlipidemia, unspecified: Secondary | ICD-10-CM

## 2023-05-23 DIAGNOSIS — E1169 Type 2 diabetes mellitus with other specified complication: Secondary | ICD-10-CM | POA: Diagnosis not present

## 2023-05-23 DIAGNOSIS — M48061 Spinal stenosis, lumbar region without neurogenic claudication: Secondary | ICD-10-CM

## 2023-05-23 DIAGNOSIS — I152 Hypertension secondary to endocrine disorders: Secondary | ICD-10-CM

## 2023-05-23 DIAGNOSIS — E1159 Type 2 diabetes mellitus with other circulatory complications: Secondary | ICD-10-CM | POA: Diagnosis not present

## 2023-05-23 DIAGNOSIS — F411 Generalized anxiety disorder: Secondary | ICD-10-CM

## 2023-05-23 DIAGNOSIS — I890 Lymphedema, not elsewhere classified: Secondary | ICD-10-CM

## 2023-05-23 DIAGNOSIS — K743 Primary biliary cirrhosis: Secondary | ICD-10-CM

## 2023-05-23 LAB — HM DIABETES FOOT EXAM

## 2023-05-23 NOTE — Assessment & Plan Note (Signed)
 Chronic, prescription for compression hose given.

## 2023-05-23 NOTE — Progress Notes (Signed)
 Patient ID: [name], female    DOB: 26-Jul-1940, 83 y.o.   MRN: [id number]  This visit was conducted in person.  BP 134/68 (BP Location: Right Arm, Patient Position: Sitting, Cuff Size: Large)   Pulse 79   Temp 99 F (37.2 C) (Temporal)   Ht 5\' 4"  (1.626 m)   Wt 174 lb 2 oz (79 kg)   SpO2 98%   BMI 29.89 kg/m    CC:  Chief Complaint  Patient presents with   Annual Exam    Part 2    Subjective:   HPI: [name] is a 83 y.o. female presenting on 05/23/2023 for Annual Exam (Part 2)  The patient presents for  review of chronic health problems. He/She also has the following acute concerns today:  She has been struggling with runny nose, cough, headache, scratchy throat.  Using zyrtec or Claritin, oc where symptoms are. nasocort.  Outside   Flowsheet Row Clinical Support from 05/22/2023 in [facility] Total Score 0      Screening hearing and vision performed and entered  in epic.    Diabetes: At goal on metformin  500 mg twice daily Lab Results  Component Value Date   HGBA1C 6.4 05/16/2023  Using medications without difficulties: Hypoglycemic episodes: none Hyperglycemic episodes: none Feet problems: none Blood Sugars averaging: 69-92 eye exam within last year: none  Hypertension:  Good control on HCTZ and lisinopril  BP Readings from Last 3 Encounters:  05/23/23 134/68  10/25/22 118/70  05/22/22 132/60  Using medication without problems or lightheadedness: none Chest pain with exertion: none Edema:  occ Short of breath: none Average home BPs: Other issues:  Elevated Cholesterol: LDL at goal less than 100, statin contraindicated given PBC Lab Results  Component Value Date   CHOL 184 05/16/2023   HDL 57.10 05/16/2023   LDLCALC 109 (H) 05/16/2023   TRIG 91.0 05/16/2023   CHOLHDL 3 05/16/2023  Using medications without problems: Muscle aches:  Diet compliance: moderate Exercise: walks as able Other  complaints:   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  Followed by Dr. Mirian Ames at [facility], followed  yearly.  GAD: Stable control on alprazolam . She is using 0.25 mg daily..  Usually uses daily.  She is not able to decrease the dose but also not escalating use. She refuses trial of SSRI as she has had side effects to Prozac  in the past.    Reviewed OV from [facility] cardiology 05/10/2021   Spinal stenosis treated by Dr. Donna Fus with ESI. Tolerable control. Uses tens unit with relief. Tylenol for pain.   Hx of chronic Anemia... stable from 2024 and before  Relevant past medical, surgical, family and social history reviewed and updated as indicated. Interim medical history since our last visit reviewed. Allergies and medications reviewed and updated. Outpatient Medications Prior to Visit  Medication Sig Dispense Refill   ALPRAZolam  (XANAX ) 0.25 MG tablet TAKE ONE TO TWO TABLETS BY MOUTH ONE TIME DAILY AS NEEDEDTAKE ONE TO TWO TABLETS BY MOUTH ONE TIME DAILY AS NEEDED 100 tablet 0   glucose blood (FREESTYLE TEST STRIPS) test strip USE TO TEST BLOOD SUGAR ONCE DAILY 100 strip 3   hydrochlorothiazide  (HYDRODIURIL ) 25 MG tablet Take 1 tablet (25 mg total) by mouth daily. 90 tablet 0   Lancets (FREESTYLE) lancets USE TO CHECK BLOOD SUGAR DAILY AS DIRECTED 100 each 3   lisinopril  (ZESTRIL ) 10 MG tablet Take 1 tablet (10 mg total) by  mouth 2 (two) times daily. 180 tablet 0   metFORMIN  (GLUCOPHAGE ) 500 MG tablet Take 1 tablet (500 mg total) by mouth 2 (two) times daily with a meal. TAKE ONE TABLET BY MOUTH TWICE A DAY WITH A MEAL 180 tablet 0   Multiple Vitamins-Minerals (CENTRUM SILVER 50+WOMEN) TABS Take 1 tablet by mouth daily.     Multiple Vitamins-Minerals (PRESERVISION AREDS 2 PO) Take by mouth.     mupirocin  cream (BACTROBAN ) 2 % Apply 1 Application topically 2 (two) times daily. 15 g 0   ursodiol (ACTIGALL) 300 MG capsule Take 2 capsules by mouth in the morning and 1 capsule by mouth in the  evening     No facility-administered medications prior to visit.     Per HPI unless specifically indicated in ROS section below Review of Systems  Constitutional:  Positive for fatigue. Negative for fever.  HENT:  Negative for ear pain.   Eyes:  Negative for pain.  Respiratory:  Negative for chest tightness and shortness of breath.   Cardiovascular:  Negative for chest pain, palpitations and leg swelling.  Gastrointestinal:  Negative for abdominal pain.  Genitourinary:  Negative for dysuria.  Musculoskeletal:  Positive for back pain.   Objective:  BP 134/68 (BP Location: Right Arm, Patient Position: Sitting, Cuff Size: Large)   Pulse 79   Temp 99 F (37.2 C) (Temporal)   Ht 5\' 4"  (1.626 m)   Wt 174 lb 2 oz (79 kg)   SpO2 98%   BMI 29.89 kg/m   Wt Readings from Last 3 Encounters:  05/23/23 174 lb 2 oz (79 kg)  05/22/23 167 lb (75.8 kg)  10/25/22 167 lb (75.8 kg)      Physical Exam Constitutional:      General: She is not in acute distress.    Appearance: Normal appearance. She is well-developed. She is not ill-appearing or toxic-appearing.  HENT:     Head: Normocephalic.     Right Ear: Hearing, tympanic membrane, ear canal and external ear normal. Tympanic membrane is not erythematous, retracted or bulging.     Left Ear: Hearing, tympanic membrane, ear canal and external ear normal. Tympanic membrane is not erythematous, retracted or bulging.     Nose: No mucosal edema or rhinorrhea.     Right Sinus: No maxillary sinus tenderness or frontal sinus tenderness.     Left Sinus: No maxillary sinus tenderness or frontal sinus tenderness.     Mouth/Throat:     Pharynx: Uvula midline.  Eyes:     General: Lids are normal. Lids are everted, no foreign bodies appreciated.     Conjunctiva/sclera: Conjunctivae normal.     Pupils: Pupils are equal, round, and reactive to light.  Neck:     Thyroid : No thyroid  mass or thyromegaly.     Vascular: No carotid bruit.     Trachea:  Trachea normal.  Cardiovascular:     Rate and Rhythm: Normal rate and regular rhythm.     Pulses: Normal pulses.     Heart sounds: Normal heart sounds, S1 normal and S2 normal. No murmur heard.    No friction rub. No gallop.  Pulmonary:     Effort: Pulmonary effort is normal. No tachypnea or respiratory distress.     Breath sounds: Normal breath sounds. No decreased breath sounds, wheezing, rhonchi or rales.  Abdominal:     General: Bowel sounds are normal.     Palpations: Abdomen is soft.     Tenderness: There is no abdominal tenderness.  Musculoskeletal:     Cervical back: Normal range of motion and neck supple.  Skin:    General: Skin is warm and dry.     Findings: No rash.  Neurological:     Mental Status: She is alert.     Cranial Nerves: Cranial nerves 2-12 are intact.     Sensory: Sensation is intact.     Motor: Tremor present.     Comments: No cogwheeling, normal gait with normal arm swing.  No shuffling.  Psychiatric:        Mood and Affect: Mood is not anxious or depressed.        Speech: Speech normal.        Behavior: Behavior normal. Behavior is cooperative.        Thought Content: Thought content normal.        Judgment: Judgment normal.        Diabetic foot exam: Normal inspection No skin breakdown No calluses  Normal DP pulses Normal sensation to light touch and monofilament Nails normal  Results for orders placed or performed in visit on 05/23/23  HM DIABETES FOOT EXAM   Collection Time: 05/23/23 12:00 AM  Result Value Ref Range   HM Diabetic Foot Exam done     This visit occurred during the SARS-CoV-2 public health emergency.  Safety protocols were in place, including screening questions prior to the visit, additional usage of staff PPE, and extensive cleaning of exam room while observing appropriate contact time as indicated for disinfecting solutions.   COVID 19 screen:  No recent travel or known exposure to COVID19 The patient denies  respiratory symptoms of COVID 19 at this time. The importance of social distancing was discussed today.   Assessment and Plan   The patient's preventative maintenance and recommended screening tests for an annual wellness exam were reviewed in full today. Brought up to date unless services declined.  Counselled on the importance of diet, exercise, and its role in overall health and mortality. The patient's FH and SH was reviewed, including their home life, tobacco status, and drug and alcohol status.   Vaccines:  Due for tdap but this is recommended against given liver issues, Uptodate with zoster ( not interested in shingrix), PNA and flu.  Has complete COVID vaccine x 3  Colon:   Brodie, 07/2004 colonoscopy, Neg stool  hemeoccult  2017,  No further needed. Mammo: 10/2017... she is not interested in continuing as she would not treat.  DEXA: allergic to calcium , overdue for eval.  She is not interested at this time, discussed again 2023.  Problem List Items Addressed This Visit     Anemia of chronic disease    Chronic, stable      Generalized anxiety disorder   Chronic, stable control  Using alprazolam  low-dose 0.25 mg daily.  She is unable to wean off this.  She refuses SSRI treatment as she has had side effects to Prozac  in the past      Hyperlipidemia associated with type 2 diabetes mellitus (HCC) (Chronic)   Chronic, stable control  She has increased risk of cardiovascular disease but refuses statin given her PBC.      Hypertension associated with diabetes (HCC) (Chronic)   Stable, chronic.  Continue current medication.   lisinopril  10 mg daily Hydrochlorothiazide  25 mg daily      Lymphedema - Primary   Chronic, prescription for compression hose given.      Primary biliary cholangitis (HCC) (Chronic)   Chronic, stable  Followed  [facility]. Mirian Am[facility] at [facility].  On Actigall 302 capsules by mouth in the morning and 1 capsule at night.      Spinal stenosis   Chronic, stable  control  Would by Dr. Elicia Ground.  Tolerable control. Uses tens unit with relief. Tylenol for pain.      Type 2 diabetes mellitus with other circulatory complications HTN (HCC) (Chronic)   Chronic, stable control  Metformin  500 mg p.o. twice daily Encouraged exercise, weight loss, healthy eating habits.        Of note Per patient she is allergic to vitamin D  Herby Lolling, MD

## 2023-06-10 DIAGNOSIS — H61002 Unspecified perichondritis of left external ear: Secondary | ICD-10-CM | POA: Diagnosis not present

## 2023-06-10 DIAGNOSIS — L570 Actinic keratosis: Secondary | ICD-10-CM | POA: Diagnosis not present

## 2023-06-10 DIAGNOSIS — Z85828 Personal history of other malignant neoplasm of skin: Secondary | ICD-10-CM | POA: Diagnosis not present

## 2023-06-10 DIAGNOSIS — D1801 Hemangioma of skin and subcutaneous tissue: Secondary | ICD-10-CM | POA: Diagnosis not present

## 2023-06-10 DIAGNOSIS — L821 Other seborrheic keratosis: Secondary | ICD-10-CM | POA: Diagnosis not present

## 2023-06-12 NOTE — Assessment & Plan Note (Signed)
Stable, chronic.  Continue current medication. ? ? ?lisinopril 10 mg daily ?Hydrochlorothiazide 25 mg daily ?

## 2023-06-12 NOTE — Assessment & Plan Note (Signed)
 Chronic, stable

## 2023-06-12 NOTE — Assessment & Plan Note (Signed)
Chronic, stable control ? ?She has increased risk of cardiovascular disease but refuses statin given her PBC. ?

## 2023-06-12 NOTE — Assessment & Plan Note (Signed)
Chronic, stable ? ?Followed by Dr. Brady at [facility].  On Actigall 302 capsules by mouth in the morning and 1 capsule at night. ?

## 2023-06-12 NOTE — Assessment & Plan Note (Signed)
Chronic, stable control ? ?Metformin 500 mg p.o. twice daily ?Encouraged exercise, weight loss, healthy eating habits. ? ?

## 2023-06-12 NOTE — Assessment & Plan Note (Signed)
 Chronic, stable control  Would by Dr. Elicia Ground.  Tolerable control. Uses tens unit with relief. Tylenol for pain.

## 2023-06-12 NOTE — Assessment & Plan Note (Signed)
Chronic, stable control ? ?Using alprazolam low-dose 0.25 mg daily.  She is unable to wean off this.  She refuses SSRI treatment as she has had side effects to Prozac in the past ?

## 2023-06-20 DIAGNOSIS — K219 Gastro-esophageal reflux disease without esophagitis: Secondary | ICD-10-CM | POA: Diagnosis not present

## 2023-06-20 DIAGNOSIS — K743 Primary biliary cirrhosis: Secondary | ICD-10-CM | POA: Diagnosis not present

## 2023-06-25 DIAGNOSIS — H353221 Exudative age-related macular degeneration, left eye, with active choroidal neovascularization: Secondary | ICD-10-CM | POA: Diagnosis not present

## 2023-06-28 ENCOUNTER — Other Ambulatory Visit: Payer: Self-pay | Admitting: Family Medicine

## 2023-07-03 ENCOUNTER — Other Ambulatory Visit: Payer: Self-pay | Admitting: Family Medicine

## 2023-07-03 NOTE — Telephone Encounter (Signed)
 Last office visit 05/23/23 for CPE.  Last refilled 04/03/2023 for #100 with no refills.  Next Appt: CPE 05/27/2024.

## 2023-07-19 ENCOUNTER — Other Ambulatory Visit: Payer: Self-pay | Admitting: Family Medicine

## 2023-08-29 DIAGNOSIS — I10 Essential (primary) hypertension: Secondary | ICD-10-CM | POA: Diagnosis not present

## 2023-08-29 DIAGNOSIS — R55 Syncope and collapse: Secondary | ICD-10-CM | POA: Diagnosis not present

## 2023-08-29 DIAGNOSIS — E7800 Pure hypercholesterolemia, unspecified: Secondary | ICD-10-CM | POA: Diagnosis not present

## 2023-09-02 DIAGNOSIS — M545 Low back pain, unspecified: Secondary | ICD-10-CM | POA: Diagnosis not present

## 2023-09-05 ENCOUNTER — Ambulatory Visit: Admitting: Family Medicine

## 2023-09-05 ENCOUNTER — Ambulatory Visit: Payer: Self-pay

## 2023-09-05 ENCOUNTER — Encounter: Payer: Self-pay | Admitting: Family Medicine

## 2023-09-05 VITALS — BP 124/62 | HR 64 | Temp 98.0°F | Ht 64.0 in | Wt 160.0 lb

## 2023-09-05 DIAGNOSIS — R350 Frequency of micturition: Secondary | ICD-10-CM

## 2023-09-05 DIAGNOSIS — K5900 Constipation, unspecified: Secondary | ICD-10-CM | POA: Diagnosis not present

## 2023-09-05 DIAGNOSIS — M5442 Lumbago with sciatica, left side: Secondary | ICD-10-CM

## 2023-09-05 LAB — POCT UA - MICROSCOPIC ONLY

## 2023-09-05 LAB — POC URINALSYSI DIPSTICK (AUTOMATED)
Bilirubin, UA: NEGATIVE
Blood, UA: NEGATIVE
Glucose, UA: NEGATIVE
Ketones, UA: NEGATIVE
Nitrite, UA: NEGATIVE
Protein, UA: NEGATIVE
Spec Grav, UA: 1.015 (ref 1.010–1.025)
Urobilinogen, UA: 0.2 U/dL
pH, UA: 5.5 (ref 5.0–8.0)

## 2023-09-05 MED ORDER — TRAMADOL HCL 50 MG PO TABS
50.0000 mg | ORAL_TABLET | Freq: Two times a day (BID) | ORAL | 0 refills | Status: DC | PRN
Start: 2023-09-05 — End: 2023-09-13

## 2023-09-05 NOTE — Assessment & Plan Note (Signed)
 Acute, this is likely worsening her back symptoms.  Recommended continuing daily Metamucil but adding MiraLAX once or twice daily as needed for constipation.  Keep up with fiber and water in diet.

## 2023-09-05 NOTE — Progress Notes (Signed)
 Patient ID: [name], female    DOB: 1941-01-05, 83 y.o.   MRN: [id number]  This visit was conducted in person.  BP 124/62   Pulse 64   Temp 98 F (36.7 C) (Oral)   Ht 5' 4 (1.626 m)   Wt 160 lb (72.6 kg) Comment: Pt could not step on the scale  SpO2 99%   BMI 27.46 kg/m    CC:  Chief Complaint  Patient presents with   Back Pain    Pt C/o Lower back pain/left hip.Pain radiates down left leg Hurt when having a BM Also urine frequency. Pain scale is 10  Started 09/01/23. Heat compress seems to help     Subjective:   HPI: [name] is a 83 y.o. female presenting on 09/05/2023 for Back Pain (Pt C/o Lower back pain/left hip.Pain radiates down left leg Hurt when having a BM Also urine frequency. Pain scale is 10  Started 09/01/23. Heat compress seems to help )    New onset pain in low back and left hip. Pain starts at back and radiates down left leg.  Pain was an electric shock from base of buttocks to left foot  No fall, no change in activity. Symptoms all started 5 days ago. Seen by KEM[facility] ETTER Beverley Millman)..  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 Treated with prednisone  dose pack.  Felt much better initially but pain returned. Worse with movement, kept her awake   Using heating pad, motrin  anor tylenol off and on.  Using walker.   Has noted increase in urinary frequency and urgency, no  blood or dysuria.  No recent UTI or antibitoics.   No BM in last 2 days... feels like has to have a BM but cannot go... took Metamucil last night.  Has had  BM this AM       FBS 91- 145  Relevant past medical, surgical, family and social history reviewed and updated as indicated. Interim medical history since our last visit reviewed. Allergies and medications reviewed and updated. Outpatient Medications Prior to Visit  Medication Sig Dispense Refill   ALPRAZolam  (XANAX ) 0.25 MG tablet TAKE 1 TO 2 TABLETS BY MOUTH DAILY AS NEEDED 100 tablet 0    glucose blood (FREESTYLE TEST STRIPS) test strip USE TO TEST BLOOD SUGAR ONCE DAILY 100 strip 3   hydrochlorothiazide  (HYDRODIURIL ) 25 MG tablet TAKE ONE TABLET BY MOUTH ONE TIME DAILY 90 tablet 3   hydrocortisone 2.5 % ointment Apply topically.     Lancets (FREESTYLE) lancets USE TO CHECK BLOOD SUGAR DAILY AS DIRECTED 100 each 3   lisinopril  (ZESTRIL ) 10 MG tablet TAKE ONE TABLET BY MOUTH TWICE A DAY 180 tablet 3   metFORMIN  (GLUCOPHAGE ) 500 MG tablet TAKE 1 TABLET BY MOUTH 2 TIMES A DAY WITH A MEAL 180 tablet 1   Multiple Vitamins-Minerals (CENTRUM SILVER 50+WOMEN) TABS Take 1 tablet by mouth daily.     Multiple Vitamins-Minerals (PRESERVISION AREDS 2 PO) Take by mouth.     mupirocin  cream (BACTROBAN ) 2 % Apply 1 Application topically 2 (two) times daily. 15 g 0   predniSONE  (STERAPRED UNI-PAK 21 TAB) 10 MG (21) TBPK tablet 10 mg 6 day dose pack   6 day one, 5 pills day 2, 4 pills, day 3, 3 pills day 4, 2 pills day 5, 1 pill day 6     ursodiol (ACTIGALL) 300 MG capsule Take 2 capsules by mouth in  the morning and 1 capsule by mouth in the evening     No facility-administered medications prior to visit.     Per HPI unless specifically indicated in ROS section below Review of Systems  Constitutional:  Negative for fatigue and fever.  HENT:  Negative for congestion.   Eyes:  Positive for discharge. Negative for pain.  Respiratory:  Negative for cough and shortness of breath.   Cardiovascular:  Negative for chest pain, palpitations and leg swelling.  Gastrointestinal:  Positive for constipation. Negative for abdominal pain.  Genitourinary:  Positive for frequency. Negative for dysuria and vaginal bleeding.  Musculoskeletal:  Positive for back pain.  Neurological:  Negative for syncope, light-headedness and headaches.  Psychiatric/Behavioral:  Negative for dysphoric mood.    Objective:  BP 124/62   Pulse 64   Temp 98 F (36.7 C) (Oral)   Ht 5' 4 (1.626 m)   Wt 160 lb (72.6 kg)  Comment: Pt could not step on the scale  SpO2 99%   BMI 27.46 kg/m   Wt Readings from Last 3 Encounters:  09/05/23 160 lb (72.6 kg)  05/23/23 174 lb 2 oz (79 kg)  05/22/23 167 lb (75.8 kg)      Physical Exam Constitutional:      General: She is not in acute distress.    Appearance: Normal appearance. She is well-developed. She is not ill-appearing or toxic-appearing.  HENT:     Head: Normocephalic.     Right Ear: Hearing, tympanic membrane, ear canal and external ear normal. Tympanic membrane is not erythematous, retracted or bulging.     Left Ear: Hearing, tympanic membrane, ear canal and external ear normal. Tympanic membrane is not erythematous, retracted or bulging.     Nose: No mucosal edema or rhinorrhea.     Right Sinus: No maxillary sinus tenderness or frontal sinus tenderness.     Left Sinus: No maxillary sinus tenderness or frontal sinus tenderness.     Mouth/Throat:     Pharynx: Uvula midline.  Eyes:     General: Lids are normal. Lids are everted, no foreign bodies appreciated.     Conjunctiva/sclera: Conjunctivae normal.     Pupils: Pupils are equal, round, and reactive to light.  Neck:     Thyroid : No thyroid  mass or thyromegaly.     Vascular: No carotid bruit.     Trachea: Trachea normal.  Cardiovascular:     Rate and Rhythm: Normal rate and regular rhythm.     Pulses: Normal pulses.     Heart sounds: Normal heart sounds, S1 normal and S2 normal. No murmur heard.    No friction rub. No gallop.  Pulmonary:     Effort: Pulmonary effort is normal. No tachypnea or respiratory distress.     Breath sounds: Normal breath sounds. No decreased breath sounds, wheezing, rhonchi or rales.  Abdominal:     General: Bowel sounds are normal.     Palpations: Abdomen is soft.     Tenderness: There is no abdominal tenderness. There is no right CVA tenderness or left CVA tenderness.  Musculoskeletal:     Cervical back: Normal range of motion and neck supple.     Lumbar back:  Tenderness present. No swelling, spasms or bony tenderness. Decreased range of motion. Positive left straight leg raise test. Negative right straight leg raise test.  Skin:    General: Skin is warm and dry.     Findings: No rash.  Neurological:     Mental Status: She is  alert.  Psychiatric:        Mood and Affect: Mood is not anxious or depressed.        Speech: Speech normal.        Behavior: Behavior normal. Behavior is cooperative.        Thought Content: Thought content normal.        Judgment: Judgment normal.       Results for orders placed or performed in visit on 09/05/23  POCT Urinalysis Dipstick (Automated)   Collection Time: 09/05/23  2:36 PM  Result Value Ref Range   Color, UA yellow    Clarity, UA clear    Glucose, UA Negative Negative   Bilirubin, UA Negative    Ketones, UA Negative    Spec Grav, UA 1.015 1.010 - 1.025   Blood, UA Negative    pH, UA 5.5 5.0 - 8.0   Protein, UA Negative Negative   Urobilinogen, UA 0.2 0.2 or 1.0 E.U./dL   Nitrite, UA Negative    Leukocytes, UA Small (1+) (A) Negative  POCT UA - Microscopic Only   Collection Time: 09/05/23  3:01 PM  Result Value Ref Range   WBC, Ur, HPF, POC 5-10 0 - 5   RBC, Urine, Miroscopic 0-2 0 - 2   Bacteria, U Microscopic none None - Trace   Mucus, UA occ    Epithelial cells, urine per micros MANY    Crystals, Ur, HPF, POC none    Casts, Ur, LPF, POC none    Yeast, UA      Assessment and Plan  Urine frequency Assessment & Plan: Acute, no additional urinary symptoms. Urinalysis shows white blood cells but microscopic suggested is contaminated sample.  Will send for culture but I doubt urinary tract infection or pyelonephritis is causing her back pain.  Orders: -     POCT Urinalysis Dipstick (Automated) -     Urine Culture -     POCT UA - Microscopic Only  Acute left-sided low back pain with left-sided sciatica Assessment & Plan: Acute, symptoms very consistent with sciatica as diagnosed by  orthopedics.   Given some initial improvement with prednisone  recommended continuing course. Encouraged her to start home stretching/physical therapy, continue heat and use extra strength Tylenol 2 tablets twice daily as needed pain.  I did give her a short term prescription for tramadol  to use for breakthrough pain if severe and interrupting sleep at night.  We did discuss being careful about this medication and sedating side effects.  Return and ER precautions provided.   Acute constipation Assessment & Plan: Acute, this is likely worsening her back symptoms.  Recommended continuing daily Metamucil but adding MiraLAX once or twice daily as needed for constipation.  Keep up with fiber and water in diet.   Other orders -     traMADol  HCl; Take 1 tablet (50 mg total) by mouth every 12 (twelve) hours as needed for up to 5 days.  Dispense: 10 tablet; Refill: 0    No follow-ups on file.   Greig Ring, MD

## 2023-09-05 NOTE — Assessment & Plan Note (Signed)
 Acute, no additional urinary symptoms. Urinalysis shows white blood cells but microscopic suggested is contaminated sample.  Will send for culture but I doubt urinary tract infection or pyelonephritis is causing her back pain.

## 2023-09-05 NOTE — Assessment & Plan Note (Signed)
 Acute, symptoms very consistent with sciatica as diagnosed by orthopedics.   Given some initial improvement with prednisone  recommended continuing course. Encouraged her to start home stretching/physical therapy, continue heat and use extra strength Tylenol 2 tablets twice daily as needed pain.  I did give her a short term prescription for tramadol  to use for breakthrough pain if severe and interrupting sleep at night.  We did discuss being careful about this medication and sedating side effects.  Return and ER precautions provided.

## 2023-09-05 NOTE — Telephone Encounter (Signed)
 Noted

## 2023-09-05 NOTE — Telephone Encounter (Signed)
 FYI Only or Action Required?: FYI only for provider.  Patient was last seen in primary care on 05/23/2023 by Avelina Greig BRAVO, MD.  Called Nurse Triage reporting Constipation, Back Pain, and Urinary Frequency.  Symptoms began several days ago.  Interventions attempted: Prescription medications: prednisone  and Rest, hydration, or home remedies.  Symptoms are: unchanged.  Triage Disposition: See HCP Within 4 Hours (Or PCP Triage)  Patient/caregiver understands and will follow disposition?: Yes   Copied from CRM [id number]. Topic: Clinical - Red Word Triage >> Sep 05, 2023  9:16 AM Gennette ORN wrote: Red Word that prompted transfer to Nurse Triage: Patient is having constipation , constantly having to urine, severe back pain. This has been going on since Sunday 1pm. Reason for Disposition  Side (flank) or lower back pain present  [1] SEVERE back pain (e.g., excruciating, unable to do any normal activities) AND [2] not improved 2 hours after pain medicine  Answer Assessment - Initial Assessment Questions 1. SYMPTOM: What's the main symptom you're concerned about? (e.g., frequency, incontinence)     frequency 2. ONSET: When did the  frequency  start?     Tuesday 3. PAIN: Is there any pain? If Yes, ask: How bad is it? (Scale: 1-10; mild, moderate, severe)     No pain 4. CAUSE: What do you think is causing the symptoms?     Urinary frequency 5. OTHER SYMPTOMS: Do you have any other symptoms? (e.g., blood in urine, fever, flank pain, pain with urination)     Back pain 10/10 buttocks down left leg-started steroid Monday after going to ortho urgent care. Used metamucil for small infrequent stool.  Additional info: Using Metamucil. Tylenol for back pain.  Blood sugar 89, up to 145  Protocols used: Urinary Symptoms-A-AH, Back Pain-A-AH

## 2023-09-06 LAB — URINE CULTURE
MICRO NUMBER:: 16800937
SPECIMEN QUALITY:: ADEQUATE

## 2023-09-09 ENCOUNTER — Ambulatory Visit: Payer: Self-pay | Admitting: Family Medicine

## 2023-09-10 NOTE — Therapy (Incomplete)
 OUTPATIENT PHYSICAL THERAPY THORACOLUMBAR EVALUATION   Patient Name: [name] MRN: [id number] DOB:[birth year], 83 y.o., female Today's Date: 09/10/2023  END OF SESSION:   Past Medical History:  Diagnosis Date   Diabetes mellitus (HCC)    Environmental allergies    Hypertension    Liver failure (HCC)    Pancreatitis    Past Surgical History:  Procedure Laterality Date   CARPAL TUNNEL RELEASE  8/91   CHOLECYSTECTOMY  10/80   COLONOSCOPY     ERCP  3/95   fusion on fingers  2000 2001   INCONTINENCE SURGERY  09/17/00   KNEE ARTHROPLASTY  10/25/95   x2   KNEE ARTHROSCOPY  7/92, 93, 94   x3   LIVER BIOPSY  5/95   perforated colon  3/07   SHOULDER ARTHROSCOPY  3/92   spur cut muscle   sigmoid colon surgery  07/17/05   Patient Active Problem List   Diagnosis Date Noted   Urine frequency 09/05/2023   Acute left-sided low back pain with left-sided sciatica 09/05/2023   Acute constipation 09/05/2023   Anemia of chronic disease 05/22/2022   Lymphedema 03/06/2022   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   Tremor 05/19/2021   Spinal stenosis 05/19/2021   Statins contraindicated 05/17/2020   Family history of alpha 1 antitrypsin deficiency 05/15/2016   Medicare annual wellness visit, subsequent 03/22/2015   Advanced care planning/counseling discussion 03/22/2015   Type 2 diabetes mellitus with other circulatory complications HTN (HCC) 12/16/2014   History of pancreatitis 12/16/2014   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  ONSET DATE: ***  FOLLOW-UP APPT SCHEDULED WITH REFERRING PROVIDER: {yes/no:20286}    SUBJECTIVE:                                                                                                                                                                                         SUBJECTIVE STATEMENT:  ***  PERTINENT HISTORY: ***  PAIN:    Pain Intensity: Present: /10, Best: /10, Worst: /10 Pain location: *** Pain Quality: {PAIN DESCRIPTION:21022940}  Radiating: {yes/no:20286}  Numbness/Tingling: {yes/no:20286} Focal Weakness: {yes/no:20286} Aggravating factors: *** Relieving factors: *** 24-hour pain behavior: *** How long can you sit: How long can you stand: History of prior  back injury, pain, surgery, or therapy: {yes/no:20286} Dominant hand: {RIGHT/LEFT:20294} Imaging: {yes/no:20286}  Red flags: Negative for bowel/bladder changes, saddle paresthesia, personal history of cancer, h/o spinal tumors, h/o compression fx, h/o abdominal aneurysm, abdominal pain, chills/fever, night sweats, nausea, vomiting, unrelenting pain, first onset of insidious LBP <20 y/o  PRECAUTIONS: {Therapy precautions:24002}  WEIGHT BEARING RESTRICTIONS: {Yes ***/No:24003}  FALLS: Has patient fallen in last 6 months? {fallsyesno:27318}  Living Environment Lives with: {OPRC lives with:25569::lives with their family} Lives in: {Lives in:25570} Stairs: {opstairs:27293} Has following equipment at home: {Assistive devices:23999}  Prior level of function: {PLOF:24004}  Occupational demands:   Hobbies:   Patient Goals: ***   OBJECTIVE:  Patient Surveys  {rehab surveys:24030}  Cognition Patient is oriented to person, place, and time.  Recent memory is intact.  Remote memory is intact.  Attention span and concentration are intact.  Expressive speech is intact.  Patient's fund of knowledge is within normal limits for educational level.    Gross Musculoskeletal  Assessment Tremor: None Bulk: Normal Tone: Normal No visible step-off along spinal column, no signs of scoliosis  GAIT: Distance walked: *** Assistive device utilized: {Assistive devices:23999} Level of assistance: {Levels of assistance:24026} Comments: ***  Posture: Lumbar lordosis: WNL Iliac crest height: Equal bilaterally Lumbar lateral shift: Negative  AROM AROM (Normal range in degrees) AROM   Lumbar   Flexion (65)   Extension (30)   Right lateral flexion (25)   Left lateral flexion (25)   Right rotation (30)   Left rotation (30)       Hip Right Left  Flexion (125)    Extension (15)    Abduction (40)    Adduction     Internal Rotation (45)    External Rotation (45)        Knee    Flexion (135)    Extension (0)        Ankle    Dorsiflexion (20)    Plantarflexion (50)    Inversion (35)    Eversion (15)    (* = pain; Blank rows = not tested)  LE MMT: MMT (out of 5) Right  Left   Hip flexion    Hip extension    Hip abduction    Hip adduction    Hip internal rotation    Hip external rotation    Knee flexion    Knee extension    Ankle dorsiflexion    Ankle plantarflexion    Ankle inversion    Ankle eversion    (* = pain; Blank rows = not tested)  Sensation Grossly intact to light touch throughout bilateral LEs as determined by testing dermatomes L2-S2. Proprioception, stereognosis, and hot/cold testing deferred on this date.  Reflexes R/L Knee Jerk (L3/4): 2+/2+  Ankle Jerk (S1/2): 2+/2+   Muscle Length Hamstrings: R: {NEGATIVE/POSITIVE QNM:80001} L: {NEGATIVE/POSITIVE QNM:80001} Ely (quadriceps): R: {NEGATIVE/POSITIVE QNM:80001} L: {NEGATIVE/POSITIVE QNM:80001} Thomas (hip flexors): R: {NEGATIVE/POSITIVE QNM:80001} L: {NEGATIVE/POSITIVE QNM:80001} Ober: R: {NEGATIVE/POSITIVE QNM:80001} L: {NEGATIVE/POSITIVE QNM:80001}  Palpation Location Right Left         Lumbar paraspinals    Quadratus Lumborum    Gluteus Maximus    Gluteus Medius     Deep hip external rotators    PSIS    Fortin's Area (SIJ)    Greater Trochanter    (Blank rows = not tested) Graded on 0-4 scale (0 = no pain, 1 = pain, 2 = pain with wincing/grimacing/flinching, 3 = pain with withdrawal, 4 = unwilling to allow palpation)  Passive  Accessory Intervertebral Motion Pt denies reproduction of back pain with CPA L1-L5 and UPA bilaterally L1-L5. Generally, hypomobile throughout  Special Tests Lumbar Radiculopathy and Discogenic: Centralization and Peripheralization (SN 92, -LR 0.12): {NEGATIVE/POSITIVE FOR:19998} Slump (SN 83, -LR 0.32): R: {NEGATIVE/POSITIVE FOR:19998} L: {NEGATIVE/POSITIVE FOR:19998} SLR (SN 92, -LR 0.29): R: {NEGATIVE/POSITIVE QNM:80001} L:  {NEGATIVE/POSITIVE QNM:80001} Crossed SLR (SP 90): R: {NEGATIVE/POSITIVE QNM:80001} L: {NEGATIVE/POSITIVE QNM:80001}  Facet Joint: Extension-Rotation (SN 100, -LR 0.0): R: {NEGATIVE/POSITIVE QNM:80001} L: {NEGATIVE/POSITIVE QNM:80001}  Lumbar Foraminal Stenosis: Lumbar quadrant (SN 70): R: {NEGATIVE/POSITIVE QNM:80001} L: {NEGATIVE/POSITIVE QNM:80001}  Hip: FABER (SN 81): R: {NEGATIVE/POSITIVE QNM:80001} L: {NEGATIVE/POSITIVE QNM:80001} FADIR (SN 94): R: {NEGATIVE/POSITIVE FOR:19998} L: {NEGATIVE/POSITIVE QNM:80001} Hip scour (SN 50): R: {NEGATIVE/POSITIVE QNM:80001} L: {NEGATIVE/POSITIVE QNM:80001}  SIJ:  Thigh Thrust (SN 88, -LR 0.18) : R: {NEGATIVE/POSITIVE QNM:80001} L: {NEGATIVE/POSITIVE QNM:80001}  Piriformis Syndrome: FAIR Test (SN 88, SP 83): R: {NEGATIVE/POSITIVE QNM:80001} L: {NEGATIVE/POSITIVE QNM:80001}  Functional Tasks Lifting: Deep squat: Sit to stand: Forward Step-Down Test: R:  L:  Lateral Step-Down Test: R:  L:   Beighton scale  LEFT  RIGHT           1. Passive dorsiflexion and hyperextension of the fifth MCP joint beyond 90  0 0   2. Passive apposition of the thumb to the flexor aspect of the forearm  0  0   3. Passive hyperextension of the elbow beyond 10  0  0    4. Passive hyperextension of the knee beyond 10  0  0   5. Active forward flexion of the trunk with the knees fully extended so that the palms of the hands rest flat on the floor   0   TOTAL         0/ 9      TODAY'S TREATMENT: DATE: ***     PATIENT EDUCATION:  Education details: *** Person educated: {Person educated:25204} Education method: {Education Method:25205} Education comprehension: {Education Comprehension:25206}   HOME EXERCISE PROGRAM:     ASSESSMENT:  CLINICAL IMPRESSION: Patient is a *** y.o. *** who was seen today for physical therapy evaluation and treatment for ***.   OBJECTIVE IMPAIRMENTS: {opptimpairments:25111}.   ACTIVITY LIMITATIONS: {activitylimitations:27494}  PARTICIPATION LIMITATIONS: {participationrestrictions:25113}  PERSONAL FACTORS: {Personal factors:25162} are also affecting patient's functional outcome.   REHAB POTENTIAL: {rehabpotential:25112}  CLINICAL DECISION MAKING: {clinical decision making:25114}  EVALUATION COMPLEXITY: {Evaluation complexity:25115}   GOALS: Goals reviewed with patient? {yes/no:20286}  SHORT TERM GOALS: Target date: {follow up:25551}  Pt will be independent with HEP in order to improve strength and decrease back pain to improve pain-free function at home and work. Baseline: *** Goal status: INITIAL   LONG TERM GOALS: Target date: {follow up:25551}  Pt will increase FOTO to at least *** to demonstrate significant improvement in function at home and work related to back pain  Baseline:  Goal status: INITIAL  2.  Pt will decrease worst back pain by at least 2 points on the NPRS in order to demonstrate clinically significant reduction in back pain. Baseline: *** Goal status: INITIAL  3.  Pt will decrease mODI score by at least 13 points in order demonstrate clinically significant reduction in back pain/disability.       Baseline: *** Goal status: INITIAL  4.  *** Baseline: *** Goal status:  INITIAL   PLAN: PT FREQUENCY: 1-2x/week  PT DURATION: {rehab duration:25117}  PLANNED INTERVENTIONS: Therapeutic exercises, Therapeutic activity, Neuromuscular re-education, Balance training, Gait training, Patient/Family education, Self Care, Joint mobilization, Joint manipulation, Vestibular training, Canalith repositioning,  Orthotic/Fit training, DME instructions, Dry Needling, Electrical stimulation, Spinal manipulation, Spinal mobilization, Cryotherapy, Moist heat, Taping, Traction, Ultrasound, Ionotophoresis 4mg /ml Dexamethasone , Manual therapy, and Re-evaluation.  PLAN FOR NEXT SESSION: ***   Lonni Pall PT, DPT Physical Therapist- [facility]  09/10/2023, 5:31 PM

## 2023-09-13 ENCOUNTER — Telehealth: Payer: Self-pay | Admitting: Family Medicine

## 2023-09-13 ENCOUNTER — Ambulatory Visit: Payer: Self-pay

## 2023-09-13 MED ORDER — TRAMADOL HCL 50 MG PO TABS: 50.0000 mg | ORAL_TABLET | Freq: Two times a day (BID) | ORAL | 0 refills | Status: AC | PRN

## 2023-09-13 NOTE — Telephone Encounter (Signed)
Tramadol not on current medication list.

## 2023-09-13 NOTE — Telephone Encounter (Signed)
 Copied from CRM [id number]. Topic: Clinical - Medication Refill >> Sep 13, 2023  9:09 AM Franky GRADE wrote: Medication: traMADol  (ULTRAM ) 50 MG tablet [[id number]]  Has the patient contacted their pharmacy? Yes, they asked patient to contact the provider (Agent: If no, request that the patient contact the pharmacy for the refill. If patient does not wish to contact the pharmacy document the reason why and proceed with request.) (Agent: If yes, when and what did the pharmacy advise?)  This is the patient's preferred pharmacy:  [facility] [id number] [place], [place] - [address] [place] [address] Phone: [phone] Fax: [phone]    Is this the correct pharmacy for this prescription? Yes If no, delete pharmacy and type the correct one.   Has the prescription been filled recently? No  Is the patient out of the medication? Yes, she tried to go without it but could not sleep due to the pain  Has the patient been seen for an appointment in the last year OR does the patient have an upcoming appointment? Yes  Can we respond through MyChart? Yes  Agent: Please be advised that Rx refills may take up to 3 business days. We ask that you follow-up with your pharmacy.

## 2023-09-13 NOTE — Telephone Encounter (Signed)
 09/05/23 10 tabs/0 RF

## 2023-09-13 NOTE — Telephone Encounter (Signed)
 Refilled patient's tramadol , sent to local pharmacy

## 2023-09-13 NOTE — Telephone Encounter (Signed)
 FYI Only or Action Required?: Action required by provider: patient requesting Tramadol  prescription.  Patient was last seen in primary care on 09/05/2023 by Avelina Greig BRAVO, MD.  Called Nurse Triage reporting Hip Pain.  Symptoms began several weeks ago.  Interventions attempted: Rest, hydration, or home remedies.  Symptoms are: unchanged.  Triage Disposition: See HCP Within 4 Hours (Or PCP Triage)  Patient/caregiver understands and will follow disposition?: Yes  Copied from CRM #[id number]. Topic: Clinical - Red Word Triage >> Sep 13, 2023  4:23 PM [place] J wrote: [facility] that prompted transfer to Nurse Triage: Patient is in an immense amount pain and is needing tramadol . She assumed that the medication would be sent in today before the weekend starts.   She said that it feels like electric shocks going through her whole body. Reason for Disposition  [1] SEVERE pain (e.g., excruciating, unable to do any normal activities) AND [2] not improved after 2 hours of pain medicine  Answer Assessment - Initial Assessment Questions 1. LOCATION and RADIATION: Where is the pain located? Does the pain spread (shoot) anywhere else?     Left hip down her leg 2. QUALITY: What does the pain feel like?  (e.g., sharp, dull, aching, burning)     Electric shocks going down her back 3. SEVERITY: How bad is the pain? What does it keep you from doing?   (Scale 1-10; or mild, moderate, severe)     severe 4. ONSET: When did the pain start? Does it come and go, or is it there all the time?     Started August 3 5. WORK OR EXERCISE: Has there been any recent work or exercise that involved this part of the body?      no 6. CAUSE: What do you think is causing the hip pain?      sciatica 7. AGGRAVATING FACTORS: What makes the hip pain worse? (e.g., walking, climbing stairs, running)     Walking, sitting in a chair 8. OTHER SYMPTOMS: Do you have any other symptoms? (e.g., back pain, pain  shooting down leg,  fever, rash)     Pain shooting down leg  Protocols used: Hip Pain-A-AH

## 2023-09-18 ENCOUNTER — Ambulatory Visit

## 2023-09-23 DIAGNOSIS — H353221 Exudative age-related macular degeneration, left eye, with active choroidal neovascularization: Secondary | ICD-10-CM | POA: Diagnosis not present

## 2023-09-23 LAB — HM DIABETES EYE EXAM

## 2023-09-24 ENCOUNTER — Ambulatory Visit: Attending: Physician Assistant

## 2023-09-24 ENCOUNTER — Ambulatory Visit

## 2023-09-24 DIAGNOSIS — M6281 Muscle weakness (generalized): Secondary | ICD-10-CM | POA: Insufficient documentation

## 2023-09-24 DIAGNOSIS — M5432 Sciatica, left side: Secondary | ICD-10-CM | POA: Insufficient documentation

## 2023-09-24 DIAGNOSIS — M5459 Other low back pain: Secondary | ICD-10-CM | POA: Diagnosis not present

## 2023-09-24 NOTE — Therapy (Signed)
 OUTPATIENT PHYSICAL THERAPY THORACOLUMBAR EVALUATION   Patient Name: [name] MRN: [id number] DOB:[birth year], 83 y.o., female Today's Date: 09/24/2023  END OF SESSION:  PT End of Session - 09/24/23 0900     Visit Number 1    Number of Visits 17    Date for PT Re-Evaluation 11/19/23    PT Start Time 0900    PT Stop Time 0945    PT Time Calculation (min) 45 min    Activity Tolerance Patient tolerated treatment well    Behavior During Therapy [facility] for tasks assessed/performed          Past Medical History:  Diagnosis Date   Diabetes mellitus (HCC)    Environmental allergies    Hypertension    Liver failure (HCC)    Pancreatitis    Past Surgical History:  Procedure Laterality Date   CARPAL TUNNEL RELEASE  8/91   CHOLECYSTECTOMY  10/80   COLONOSCOPY     ERCP  3/95   fusion on fingers  2000 2001   INCONTINENCE SURGERY  09/17/00   KNEE ARTHROPLASTY  10/25/95   x2   KNEE ARTHROSCOPY  7/92, 93, 94   x3   LIVER BIOPSY  5/95   perforated colon  3/07   SHOULDER ARTHROSCOPY  3/92   spur cut muscle   sigmoid colon surgery  07/17/05   Patient Active Problem List   Diagnosis Date Noted   Urine frequency 09/05/2023   Acute left-sided low back pain with left-sided sciatica 09/05/2023   Acute constipation 09/05/2023   Anemia of chronic disease 05/22/2022   Lymphedema 03/06/2022   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   Tremor 05/19/2021   Spinal stenosis 05/19/2021   Statins contraindicated 05/17/2020   Family history of alpha 1 antitrypsin deficiency 05/15/2016   Medicare annual wellness visit, subsequent 03/22/2015   Advanced care planning/counseling discussion 03/22/2015   Type 2 diabetes mellitus with other circulatory complications HTN (HCC) 12/16/2014   History of pancreatitis 12/16/2014   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   Hypertension associated with diabetes (HCC) 12/16/2014   GERD (gastroesophageal reflux disease) 12/16/2014   Allergic rhinitis 12/16/2014    Generalized anxiety disorder 12/16/2014   Hyperlipidemia associated with type 2 diabetes mellitus (HCC) 12/16/2014   Bilateral tinnitus 12/16/2014    PCP: Avelina Greig BRAVO, MD  REFERRING PROVIDER: Ottie Chew, PA-C  REFERRING DIAG:  M54.16 (ICD-10-CM) - Lumbar radiculopathy    RATIONALE FOR EVALUATION AND TREATMENT: Rehabilitation  THERAPY DIAG: Other low back pain  Muscle weakness (generalized)  ONSET DATE: 09/01/2023  FOLLOW-UP APPT SCHEDULED WITH REFERRING PROVIDER: No    SUBJECTIVE:  SUBJECTIVE STATEMENT:    Patient arrives to OPPT with a chief concern of lower back pain.   PERTINENT HISTORY:   [name] is an 83 y.o. female reporting to OPPT with L lower back pain. She reports that the lower back pain started on 09/01/23. Her pain begins in the lower back and radiates throughout her L hip into the foot. She denies bilateral symptoms however sometimes when she moves her RLE it worsens symptoms in her LLE. She was seen by [facility] in early August and was given a set of exercises which she has performed in order to mitigate pain. At worst (early August) she reports that she would be unable to sit for prolonged periods in chairs with minimal support. Patient reports that she believes that her pain is sciatic related and focal to the L gluteal region. Aggravating factors include:  prolonged sitting in soft chair, R sidelying positions, prolonged walking. Alleviating factors: heat modalities, TENS unit, moving around.    She denies direct trauma to lower back, loss of bowel/bladder changes and saddle paresthesia.   Imaging (10/12/2020):  CLINICAL DATA:  Low back pain   EXAM: MRI LUMBAR SPINE WITHOUT CONTRAST   TECHNIQUE: Multiplanar, multisequence MR imaging of the lumbar spine  was performed. No intravenous contrast was administered.   COMPARISON:  MRI lumbar spine 03/08/2013   FINDINGS: Segmentation:  Normal   Alignment:  Moderate lumbar dextroscoliosis   Mild retrolisthesis L3-4.  Remaining alignment normal.   Vertebrae:  Negative for fracture or mass.   Conus medullaris and cauda equina: Conus extends to the L1-2 level. Conus and cauda equina appear normal.   Paraspinal and other soft tissues: Negative for paraspinous mass or adenopathy.   Disc levels:   L1-2: Negative   L2-3: Shallow central disc protrusion. Bilateral facet hypertrophy. Mild subarticular stenosis bilaterally. Spinal canal adequate in size   L3-4: Asymmetric disc degeneration and spurring related to scoliosis. Mild subarticular and foraminal stenosis on the left due to spurring. No interval change   L4-5: Shallow central disc protrusion. Left laminectomy. Bilateral facet hypertrophy. Moderate subarticular stenosis bilaterally. No interval change.   L5-S1: Asymmetric disc degeneration and spurring on the right. Moderate facet degeneration bilaterally. Right L5 nerve root impingement in the foramen similar to the prior study.   IMPRESSION: Mild subarticular stenosis bilaterally L2-3 and L3-4   Moderate subarticular stenosis bilaterally L4-5   Right L5 nerve root impingement in the foramen at L5-S1 similar to the prior study.   Moderate dextroscoliosis.     Electronically Signed   By: Carlin Gaskins M.D.   On: 10/12/2020 16:36    PAIN:    Pain Intensity: Present: 0/10, Best: 0/10, Worst: 8/10 Pain location: L lower back, L gluteal  Pain Quality: intermittent and sharp  Numbness/Tingling: Yes History of prior back injury, pain, surgery, or therapy: Yes  PRECAUTIONS: Fall  WEIGHT BEARING RESTRICTIONS: No  FALLS: Has patient fallen in last 6 months? No  Living Environment Lives with: lives with their spouse Lives in: House/apartment Stairs: Yes: External:  3 steps; on right going up Has following equipment at home: Single point cane  Prior level of function: Independent  Occupational demands: Retired  Presenter, broadcasting:   Patient Goals: I would like to be able to sit longer and decrease my pain    OBJECTIVE:  Patient Surveys  mODI: 11 / 50 = 22.0 % - moderate disability  Cognition WNL     Gross Musculoskeletal Assessment Tremor: None Bulk: Increased muscle tension along  lumbar paraspinal musculature Tone: Normal  GAIT: Distance walked: 64m  Assistive device utilized: Single point cane Level of assistance: Modified independence Comments: Decreased Step Length, Decreased Stride Length, Slowed Velocity   Posture: Lumbar lordosis: WNL Iliac crest height: Equal bilaterally Lumbar lateral shift: Negative  Sitting: Increased Thoracic Kyphosis, Forward Posture, Rounded Shoulders   AROM AROM (Normal range in degrees) AROM   Lumbar   Flexion (65) 100%  Extension (30) 100%  Right lateral flexion (25) 100%  Left lateral flexion (25) 100%  Right rotation (30) 100%  Left rotation (30) 100%      Hip Right Left  Flexion (125) WNL WNL  Extension (15)    Abduction (40)    Adduction     Internal Rotation (45) 35 deg  20 deg  External Rotation (45) 40 deg 45 deg      Knee    Flexion (135) WNL WNL  Extension (0) WNL WNL      Ankle    Dorsiflexion (20)    Plantarflexion (50)    Inversion (35)    Eversion (15)    (* = pain; Blank rows = not tested)  LE MMT: MMT (out of 5) Right  Left   Hip flexion 3+ 3+  Hip extension    Hip abduction 4 4  Hip adduction 5 5  Hip internal rotation 4- 4-  Hip external rotation 4- 4-  Knee flexion 4- 4-  Knee extension 4- 4-  Ankle dorsiflexion 5 5  Ankle plantarflexion 5 5  Ankle inversion    Ankle eversion    (* = pain; Blank rows = not tested)  Sensation Grossly intact to light touch throughout bilateral LEs as determined by testing dermatomes L2-S2. Proprioception, stereognosis,  and hot/cold testing deferred on this date.  Reflexes R/L Knee Jerk (L3/4): Hyporeflexive  Ankle Jerk (S1/2): Hyporeflexive  Muscle Length Hamstrings: R: Positive for tightness - 25 deg L: Positive Thomas (hip flexors): R: Not examined L: Not examined  Palpation Location Right Left         Lumbar paraspinals 0 1  Quadratus Lumborum 0 0  Gluteus Maximus 0 0  Gluteus Medius 0 1  Deep hip external rotators 0 1  Piriformis 0 2  Fortin's Area (SIJ)    Greater Trochanter 0 0  (Blank rows = not tested) Graded on 0-4 scale (0 = no pain, 1 = pain, 2 = pain with wincing/grimacing/flinching, 3 = pain with withdrawal, 4 = unwilling to allow palpation)  Passive Accessory Intervertebral Motion Pt denies reproduction of back pain with CPA L1-L5 and UPA bilaterally L1-L5. Generally, hypomobile throughout  Special Tests Lumbar Radiculopathy and Discogenic: SLR (SN 92, -LR 0.29): R: Negative L:  Positive for pain (35 -40 deg) Crossed SLR (SP 90): R: Negative L: Negative  Facet Joint: Extension-Rotation (SN 100, -LR 0.0):  L: Negative  Hip: FABER (SN 81): R: Negative L: Negative FADIR (SN 94): R: Negative L: Negative  TODAY'S TREATMENT: DATE: 09/24/23  Self Care Home Management:   PT education on HEP (sets, reps, frequency) and managing specific positions with sitting. Education on proper stretches to perform while sitting and upright posture to reduce disc bulge. PT education on use of TENS unit in order to reduce lower back pain while performing household ADLs. Patient verbalized understanding of HEP.    Additional education on proper sleeping position; R sidelying position preferred - PT educated on proper use of pillow support in order to maintain neutral pelvic/hip angle and reduce stretch  to gluteal muscles.      PATIENT EDUCATION:  Education details: HEP, POC, Prognosis  Person educated: Patient Education method: Explanation, Demonstration, and Handouts Education  comprehension: verbalized understanding and needs further education   HOME EXERCISE PROGRAM:  Access Code: [email or web address] URL: [email or web address] Date: 09/24/2023 Prepared by: Lonni Pall  Exercises - Standing Lumbar Extension  - 1 x daily - 7 x weekly - 3 sets - 10-12 reps - Seated Piriformis Stretch  - 1 x daily - 7 x weekly - 3 sets - 30s hold - Supine Bridge  - 1 x daily - 7 x weekly - 3 sets - 10-15 reps - Supine Lower Trunk Rotation  - 1 x daily - 7 x weekly - 3 sets - 10 reps - Sidelying Hip Abduction  - 1 x daily - 7 x weekly - 2-3 sets - 10-12 reps - Supine Single Knee to Chest  - 1 x daily - 7 x weekly - 3 sets - 30s hold   ASSESSMENT:  CLINICAL IMPRESSION: Melannie Metzner is a 83 y.o. female who was seen today for physical therapy evaluation and treatment for lower back pain with sciatic distribution. Patient with gross normal ROM however strength deficits throughout bilateral LE. Concordant symptoms strictly on the L lower back and LLE; elicited with direct pressure to piriformis/deep gluteal muscles and extension. Repeated extensions in standing with minor relief and good presentation to centralize from hip to lower back. Self reported score on mODI of 11 out of 50, indicating a minimal evel of disability (22%). This suggests that the patient is experiencing moderate functional limitations affecting most aspects of daily living. PT educated patient on proper seated positions and use of lumbar support while in recliner in order to decrease slouching and risk of disc herniation.  PT to assess gait speed and functional strength/activities (Squatting, Sit to stand transfer) in next appointment. Initial HEP focused on lumbar mobility and hip stretching in order to mitigate pain. She will benefit from skilled PT in order to address current deficits and maximize return to PLOF.   OBJECTIVE IMPAIRMENTS: decreased activity tolerance, difficulty walking, decreased ROM, decreased  strength, and pain.   ACTIVITY LIMITATIONS: sitting, standing, squatting, and sleeping  PARTICIPATION LIMITATIONS: driving and community activity  PERSONAL FACTORS: Age, Past/current experiences, Time since onset of injury/illness/exacerbation, and 1-2 comorbidities: Hx of Spinal Stenosis, DM are also affecting patient's functional outcome.   REHAB POTENTIAL: Good  CLINICAL DECISION MAKING: Evolving/moderate complexity  EVALUATION COMPLEXITY: Moderate   GOALS: Goals reviewed with patient? No  SHORT TERM GOALS: Target date: 10/22/2023  Pt will be independent with HEP in order to improve strength and decrease back pain to improve pain-free function at home and work. Baseline: initial hep provided  Goal status: INITIAL   LONG TERM GOALS: Target date: 11/19/2023  Pt will decrease 5TSTS by at least 3 seconds in order to demonstrate clinically significant improvement in LE strength Baseline: TBD Goal status: INITIAL  2.  Pt will decrease worst back pain by at least 2 points on the NPRS in order to demonstrate clinically significant reduction in back pain. Baseline: 09/24/2023: 10/10 Goal status: INITIAL  3.  Pt will decrease mODI score by at least 13 points in order demonstrate clinically significant reduction in back pain/disability.       Baseline: 09/24/2023: 22% Goal status: INITIAL  4.  Pt will report ability to sit > 30 min- 1 hr without pain in lower back/L hip in order to demonstrate significant reduction in  lower back pain and improvements in sitting.   Baseline: 09/24/2023: > 5/10 with prolonged sitting  Goal status: INITIAL   PLAN: PT FREQUENCY: 1-2x/week  PT DURATION: 8 weeks  PLANNED INTERVENTIONS: Therapeutic exercises, Therapeutic activity, Neuromuscular re-education, Balance training, Gait training, Patient/Family education, Self Care, Joint mobilization, Joint manipulation, Vestibular training, Canalith repositioning, Orthotic/Fit training, DME instructions,  Dry Needling, Electrical stimulation, Spinal manipulation, Spinal mobilization, Cryotherapy, Moist heat, Taping, Traction, Ultrasound, Ionotophoresis 4mg /ml Dexamethasone , Manual therapy, and Re-evaluation.  PLAN FOR NEXT SESSION: Review HEP; initiate core strengthening, hip strengthening, sciatic nerve glide   Lonni Pall PT, DPT Physical Therapist- [place]  09/24/2023, 9:06 AM

## 2023-09-29 ENCOUNTER — Other Ambulatory Visit: Payer: Self-pay | Admitting: Family Medicine

## 2023-10-01 ENCOUNTER — Ambulatory Visit: Attending: Physician Assistant

## 2023-10-01 DIAGNOSIS — M6281 Muscle weakness (generalized): Secondary | ICD-10-CM | POA: Insufficient documentation

## 2023-10-01 DIAGNOSIS — M5459 Other low back pain: Secondary | ICD-10-CM | POA: Insufficient documentation

## 2023-10-01 DIAGNOSIS — M5432 Sciatica, left side: Secondary | ICD-10-CM | POA: Diagnosis present

## 2023-10-01 NOTE — Telephone Encounter (Signed)
 Last office visit 09/05/23 for urinary frequency.  Last refilled 07/03/23 for #100 with no refills.  Next Appt: 05/27/24 for CPE.

## 2023-10-01 NOTE — Therapy (Signed)
 OUTPATIENT PHYSICAL THERAPY THORACOLUMBAR TREATMENT   Patient Name: [name] MRN: [id number] DOB:1940/02/28, 83 y.o., female Today's Date: 10/01/2023  END OF SESSION:  PT End of Session - 10/01/23 1436     Visit Number 2    Number of Visits 17    Date for PT Re-Evaluation 11/19/23    PT Start Time 1434    PT Stop Time 1515    PT Time Calculation (min) 41 min    Activity Tolerance Patient tolerated treatment well    Behavior During Therapy [facility] for tasks assessed/performed          Past Medical History:  Diagnosis Date   Diabetes mellitus (HCC)    Environmental allergies    Hypertension    Liver failure (HCC)    Pancreatitis    Past Surgical History:  Procedure Laterality Date   CARPAL TUNNEL RELEASE  8/91   CHOLECYSTECTOMY  10/80   COLONOSCOPY     ERCP  3/95   fusion on fingers  2000 2001   INCONTINENCE SURGERY  09/17/00   KNEE ARTHROPLASTY  10/25/95   x2   KNEE ARTHROSCOPY  7/92, 93, 94   x3   LIVER BIOPSY  5/95   perforated colon  3/07   SHOULDER ARTHROSCOPY  3/92   spur cut muscle   sigmoid colon surgery  07/17/05   Patient Active Problem List   Diagnosis Date Noted   Urine frequency 09/05/2023   Acute left-sided low back pain with left-sided sciatica 09/05/2023   Acute constipation 09/05/2023   Anemia of chronic disease 05/22/2022   Lymphedema 03/06/2022   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   Tremor 05/19/2021   Spinal stenosis 05/19/2021   Statins contraindicated 05/17/2020   Family history of alpha 1 antitrypsin deficiency 05/15/2016   Medicare annual wellness visit, subsequent 03/22/2015   Advanced care planning/counseling discussion 03/22/2015   Type 2 diabetes mellitus with other circulatory complications HTN (HCC) 12/16/2014   History of pancreatitis 12/16/2014   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   Hypertension associated with diabetes (HCC) 12/16/2014   GERD (gastroesophageal reflux disease) 12/16/2014   Allergic rhinitis 12/16/2014    Generalized anxiety disorder 12/16/2014   Hyperlipidemia associated with type 2 diabetes mellitus (HCC) 12/16/2014   Bilateral tinnitus 12/16/2014    PCP: Avelina Greig BRAVO, MD  REFERRING PROVIDER: Ottie Chew, PA-C  REFERRING DIAG:  M54.16 (ICD-10-CM) - Lumbar radiculopathy    RATIONALE FOR EVALUATION AND TREATMENT: Rehabilitation  THERAPY DIAG: Other low back pain  Muscle weakness (generalized)  Sciatica, left side  ONSET DATE: 09/01/2023  FOLLOW-UP APPT SCHEDULED WITH REFERRING PROVIDER: No    SUBJECTIVE:  SUBJECTIVE STATEMENT:    Patient reports 95% improvement naturally. Can't do her supine exercises because of pain.  PERTINENT HISTORY:   [name] is an 83 y.o. female reporting to OPPT with L lower back pain. She reports that the lower back pain started on 09/01/23. Her pain begins in the lower back and radiates throughout her L hip into the foot. She denies bilateral symptoms however sometimes when she moves her RLE it worsens symptoms in her LLE. She was seen by [facility] in early August and was given a set of exercises which she has performed in order to mitigate pain. At worst (early August) she reports that she would be unable to sit for prolonged periods in chairs with minimal support. Patient reports that she believes that her pain is sciatic related and focal to the L gluteal region. Aggravating factors include:  prolonged sitting in soft chair, R sidelying positions, prolonged walking. Alleviating factors: heat modalities, TENS unit, moving around.    She denies direct trauma to lower back, loss of bowel/bladder changes and saddle paresthesia.   Imaging (10/12/2020):  CLINICAL DATA:  Low back pain   EXAM: MRI LUMBAR SPINE WITHOUT CONTRAST   TECHNIQUE: Multiplanar,  multisequence MR imaging of the lumbar spine was performed. No intravenous contrast was administered.   COMPARISON:  MRI lumbar spine 03/08/2013   FINDINGS: Segmentation:  Normal   Alignment:  Moderate lumbar dextroscoliosis   Mild retrolisthesis L3-4.  Remaining alignment normal.   Vertebrae:  Negative for fracture or mass.   Conus medullaris and cauda equina: Conus extends to the L1-2 level. Conus and cauda equina appear normal.   Paraspinal and other soft tissues: Negative for paraspinous mass or adenopathy.   Disc levels:   L1-2: Negative   L2-3: Shallow central disc protrusion. Bilateral facet hypertrophy. Mild subarticular stenosis bilaterally. Spinal canal adequate in size   L3-4: Asymmetric disc degeneration and spurring related to scoliosis. Mild subarticular and foraminal stenosis on the left due to spurring. No interval change   L4-5: Shallow central disc protrusion. Left laminectomy. Bilateral facet hypertrophy. Moderate subarticular stenosis bilaterally. No interval change.   L5-S1: Asymmetric disc degeneration and spurring on the right. Moderate facet degeneration bilaterally. Right L5 nerve root impingement in the foramen similar to the prior study.   IMPRESSION: Mild subarticular stenosis bilaterally L2-3 and L3-4   Moderate subarticular stenosis bilaterally L4-5   Right L5 nerve root impingement in the foramen at L5-S1 similar to the prior study.   Moderate dextroscoliosis.     Electronically Signed   By: Carlin Gaskins M.D.   On: 10/12/2020 16:36    PAIN:    Pain Intensity: Present: 0/10, Best: 0/10, Worst: 8/10 Pain location: L lower back, L gluteal  Pain Quality: intermittent and sharp  Numbness/Tingling: Yes History of prior back injury, pain, surgery, or therapy: Yes  PRECAUTIONS: Fall  WEIGHT BEARING RESTRICTIONS: No  FALLS: Has patient fallen in last 6 months? No  Living Environment Lives with: lives with their  spouse Lives in: House/apartment Stairs: Yes: External: 3 steps; on right going up Has following equipment at home: Single point cane  Prior level of function: Independent  Occupational demands: Retired  Presenter, broadcasting:   Patient Goals: I would like to be able to sit longer and decrease my pain    OBJECTIVE:  Patient Surveys  mODI: 11 / 50 = 22.0 % - moderate disability  Cognition WNL     Gross Musculoskeletal Assessment Tremor: None Bulk: Increased muscle tension along  lumbar paraspinal musculature Tone: Normal  GAIT: Distance walked: 81m  Assistive device utilized: Single point cane Level of assistance: Modified independence Comments: Decreased Step Length, Decreased Stride Length, Slowed Velocity   Posture: Lumbar lordosis: WNL Iliac crest height: Equal bilaterally Lumbar lateral shift: Negative  Sitting: Increased Thoracic Kyphosis, Forward Posture, Rounded Shoulders   AROM AROM (Normal range in degrees) AROM   Lumbar   Flexion (65) 100%  Extension (30) 100%  Right lateral flexion (25) 100%  Left lateral flexion (25) 100%  Right rotation (30) 100%  Left rotation (30) 100%      Hip Right Left  Flexion (125) WNL WNL  Extension (15)    Abduction (40)    Adduction     Internal Rotation (45) 35 deg  20 deg  External Rotation (45) 40 deg 45 deg      Knee    Flexion (135) WNL WNL  Extension (0) WNL WNL      Ankle    Dorsiflexion (20)    Plantarflexion (50)    Inversion (35)    Eversion (15)    (* = pain; Blank rows = not tested)  LE MMT: MMT (out of 5) Right  Left   Hip flexion 3+ 3+  Hip extension    Hip abduction 4 4  Hip adduction 5 5  Hip internal rotation 4- 4-  Hip external rotation 4- 4-  Knee flexion 4- 4-  Knee extension 4- 4-  Ankle dorsiflexion 5 5  Ankle plantarflexion 5 5  Ankle inversion    Ankle eversion    (* = pain; Blank rows = not tested)  Sensation Grossly intact to light touch throughout bilateral LEs as determined  by testing dermatomes L2-S2. Proprioception, stereognosis, and hot/cold testing deferred on this date.  Reflexes R/L Knee Jerk (L3/4): Hyporeflexive  Ankle Jerk (S1/2): Hyporeflexive  Muscle Length Hamstrings: R: Positive for tightness - 25 deg L: Positive Thomas (hip flexors): R: Not examined L: Not examined  Palpation Location Right Left         Lumbar paraspinals 0 1  Quadratus Lumborum 0 0  Gluteus Maximus 0 0  Gluteus Medius 0 1  Deep hip external rotators 0 1  Piriformis 0 2  Fortin's Area (SIJ)    Greater Trochanter 0 0  (Blank rows = not tested) Graded on 0-4 scale (0 = no pain, 1 = pain, 2 = pain with wincing/grimacing/flinching, 3 = pain with withdrawal, 4 = unwilling to allow palpation)  Passive Accessory Intervertebral Motion Pt denies reproduction of back pain with CPA L1-L5 and UPA bilaterally L1-L5. Generally, hypomobile throughout  Special Tests Lumbar Radiculopathy and Discogenic: SLR (SN 92, -LR 0.29): R: Negative L:  Positive for pain (35 -40 deg) Crossed SLR (SP 90): R: Negative L: Negative  Facet Joint: Extension-Rotation (SN 100, -LR 0.0):  L: Negative  Hip: FABER (SN 81): R: Negative L: Negative FADIR (SN 94): R: Negative L: Negative  TODAY'S TREATMENT: DATE: 10/01/23   There.ex:   Reviewed HEP   Seated exercise:     Clamshells with blue TB: 3x20    Reverse clamshells with GTB: 2x12/side   There.Act:    Side stepping with 4# AW's: 2x6 laps with seated rest b/t   Mini Squats with BUE support      Self Care Home Management:   PT education on HEP (sets, reps, frequency) and managing specific positions with sitting. Education on proper stretches to perform while sitting and upright posture to reduce disc  bulge. PT education on use of TENS unit in order to reduce lower back pain while performing household ADLs. Patient verbalized understanding of HEP.    Additional education on proper sleeping position; R sidelying position preferred - PT  educated on proper use of pillow support in order to maintain neutral pelvic/hip angle and reduce stretch to gluteal muscles.      PATIENT EDUCATION:  Education details: HEP, POC, Prognosis  Person educated: Patient Education method: Explanation, Demonstration, and Handouts Education comprehension: verbalized understanding and needs further education   HOME EXERCISE PROGRAM:  Access Code: [email or web address] URL: [email or web address] Date: 10/01/2023 Prepared by: Dorina Kingfisher  Exercises - Standing Lumbar Extension  - 1 x daily - 7 x weekly - 3 sets - 10-12 reps - Seated Piriformis Stretch  - 1 x daily - 7 x weekly - 3 sets - 30s hold - Seated Single Knee to Chest  - 1 x daily - 7 x weekly - 3 sets - 10 reps - Seated Hip Abduction  - 1 x daily - 7 x weekly - 3 sets - 10 reps - Sidelying Reverse Clamshell with Resistance  - 1 x daily - 7 x weekly - 3 sets - 10 reps   Access Code: [email or web address] URL: [email or web address] Date: 09/24/2023 Prepared by: Lonni Pall  Exercises - Standing Lumbar Extension  - 1 x daily - 7 x weekly - 3 sets - 10-12 reps - Seated Piriformis Stretch  - 1 x daily - 7 x weekly - 3 sets - 30s hold - Supine Bridge  - 1 x daily - 7 x weekly - 3 sets - 10-15 reps - Supine Lower Trunk Rotation  - 1 x daily - 7 x weekly - 3 sets - 10 reps - Sidelying Hip Abduction  - 1 x daily - 7 x weekly - 2-3 sets - 10-12 reps - Supine Single Knee to Chest  - 1 x daily - 7 x weekly - 3 sets - 30s hold   ASSESSMENT:  CLINICAL IMPRESSION: Pt arriving for first treatment session. Required new HEP as pt reports not being able to tolerate supine or side lying therex due to pain. Updated HEP with resisted hip rotation exercises. Pt with notable L hip IR weakness likely from prior orthopedic trauma as a child. Pt does endorse reproduction of gluteal/hip pain with abduction and rotary motions. Encouraged to trouble shoot at home with pattern recognition to assist  in PT with pain relief. Pt understanding with new HEP provided. Pt will benefit from skilled PT in order to address current deficits and maximize return to PLOF.    OBJECTIVE IMPAIRMENTS: decreased activity tolerance, difficulty walking, decreased ROM, decreased strength, and pain.   ACTIVITY LIMITATIONS: sitting, standing, squatting, and sleeping  PARTICIPATION LIMITATIONS: driving and community activity  PERSONAL FACTORS: Age, Past/current experiences, Time since onset of injury/illness/exacerbation, and 1-2 comorbidities: Hx of Spinal Stenosis, DM are also affecting patient's functional outcome.   REHAB POTENTIAL: Good  CLINICAL DECISION MAKING: Evolving/moderate complexity  EVALUATION COMPLEXITY: Moderate   GOALS: Goals reviewed with patient? No  SHORT TERM GOALS: Target date: 10/29/2023  Pt will be independent with HEP in order to improve strength and decrease back pain to improve pain-free function at home and work. Baseline: initial hep provided  Goal status: INITIAL   LONG TERM GOALS: Target date: 11/26/2023  Pt will decrease 5TSTS by at least 3 seconds in order to demonstrate clinically significant improvement in LE strength Baseline: TBD Goal status: INITIAL  2.  Pt will decrease worst back pain by at least 2 points on the NPRS in order to demonstrate clinically significant reduction in back pain. Baseline: 09/24/2023: 10/10 Goal status: INITIAL  3.  Pt will decrease mODI score by at least 13 points in order demonstrate clinically significant reduction in back pain/disability.       Baseline: 09/24/2023: 22% Goal status: INITIAL  4.  Pt will report ability to sit > 30 min- 1 hr without pain in lower back/L hip in order to demonstrate significant reduction in lower back pain and improvements in sitting.   Baseline: 09/24/2023: > 5/10 with prolonged sitting  Goal status: INITIAL   PLAN: PT FREQUENCY: 1-2x/week  PT DURATION: 8 weeks  PLANNED INTERVENTIONS:  Therapeutic exercises, Therapeutic activity, Neuromuscular re-education, Balance training, Gait training, Patient/Family education, Self Care, Joint mobilization, Joint manipulation, Vestibular training, Canalith repositioning, Orthotic/Fit training, DME instructions, Dry Needling, Electrical stimulation, Spinal manipulation, Spinal mobilization, Cryotherapy, Moist heat, Taping, Traction, Ultrasound, Ionotophoresis 4mg /ml Dexamethasone , Manual therapy, and Re-evaluation.  PLAN FOR NEXT SESSION: Review updated HEP; initiate core strengthening, hip strengthening, sciatic nerve glide   Dorina HERO. Fairly IV, PT, DPT Physical Therapist- [place]  [facility] 10/01/2023, 3:25 PM

## 2023-10-03 ENCOUNTER — Ambulatory Visit

## 2023-10-03 DIAGNOSIS — M5459 Other low back pain: Secondary | ICD-10-CM | POA: Diagnosis not present

## 2023-10-03 DIAGNOSIS — M5432 Sciatica, left side: Secondary | ICD-10-CM

## 2023-10-03 DIAGNOSIS — M6281 Muscle weakness (generalized): Secondary | ICD-10-CM | POA: Diagnosis not present

## 2023-10-03 NOTE — Therapy (Signed)
 OUTPATIENT PHYSICAL THERAPY THORACOLUMBAR TREATMENT   Patient Name: [name] MRN: [id number] DOB:Apr 14, 1940, 83 y.o., female Today's Date: 10/03/2023  END OF SESSION:  PT End of Session - 10/03/23 1305     Visit Number 3    Number of Visits 17    Date for PT Re-Evaluation 11/19/23    PT Start Time 1303    PT Stop Time 1345    PT Time Calculation (min) 42 min    Activity Tolerance Patient tolerated treatment well    Behavior During Therapy [facility] for tasks assessed/performed          Past Medical History:  Diagnosis Date   Diabetes mellitus (HCC)    Environmental allergies    Hypertension    Liver failure (HCC)    Pancreatitis    Past Surgical History:  Procedure Laterality Date   CARPAL TUNNEL RELEASE  8/91   CHOLECYSTECTOMY  10/80   COLONOSCOPY     ERCP  3/95   fusion on fingers  2000 2001   INCONTINENCE SURGERY  09/17/00   KNEE ARTHROPLASTY  10/25/95   x2   KNEE ARTHROSCOPY  7/92, 93, 94   x3   LIVER BIOPSY  5/95   perforated colon  3/07   SHOULDER ARTHROSCOPY  3/92   spur cut muscle   sigmoid colon surgery  07/17/05   Patient Active Problem List   Diagnosis Date Noted   Urine frequency 09/05/2023   Acute left-sided low back pain with left-sided sciatica 09/05/2023   Acute constipation 09/05/2023   Anemia of chronic disease 05/22/2022   Lymphedema 03/06/2022   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   Tremor 05/19/2021   Spinal stenosis 05/19/2021   Statins contraindicated 05/17/2020   Family history of alpha 1 antitrypsin deficiency 05/15/2016   Medicare annual wellness visit, subsequent 03/22/2015   Advanced care planning/counseling discussion 03/22/2015   Type 2 diabetes mellitus with other circulatory complications HTN (HCC) 12/16/2014   History of pancreatitis 12/16/2014   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   Hypertension associated with diabetes (HCC) 12/16/2014   GERD (gastroesophageal reflux disease) 12/16/2014   Allergic rhinitis 12/16/2014    Generalized anxiety disorder 12/16/2014   Hyperlipidemia associated with type 2 diabetes mellitus (HCC) 12/16/2014   Bilateral tinnitus 12/16/2014    PCP: Avelina Greig BRAVO, MD  REFERRING PROVIDER: Ottie Chew, PA-C  REFERRING DIAG:  M54.16 (ICD-10-CM) - Lumbar radiculopathy    RATIONALE FOR EVALUATION AND TREATMENT: Rehabilitation  THERAPY DIAG: Other low back pain  Muscle weakness (generalized)  Sciatica, left side  ONSET DATE: 09/01/2023  FOLLOW-UP APPT SCHEDULED WITH REFERRING PROVIDER: No    SUBJECTIVE:  SUBJECTIVE STATEMENT:    Patient reports 95% improvement naturally. Can't do her supine exercises because of pain.  PERTINENT HISTORY:   [name] is an 83 y.o. female reporting to OPPT with L lower back pain. She reports that the lower back pain started on 09/01/23. Her pain begins in the lower back and radiates throughout her L hip into the foot. She denies bilateral symptoms however sometimes when she moves her RLE it worsens symptoms in her LLE. She was seen by [facility] in early August and was given a set of exercises which she has performed in order to mitigate pain. At worst (early August) she reports that she would be unable to sit for prolonged periods in chairs with minimal support. Patient reports that she believes that her pain is sciatic related and focal to the L gluteal region. Aggravating factors include:  prolonged sitting in soft chair, R sidelying positions, prolonged walking. Alleviating factors: heat modalities, TENS unit, moving around.    She denies direct trauma to lower back, loss of bowel/bladder changes and saddle paresthesia.   Imaging (10/12/2020):  CLINICAL DATA:  Low back pain   EXAM: MRI LUMBAR SPINE WITHOUT CONTRAST   TECHNIQUE: Multiplanar,  multisequence MR imaging of the lumbar spine was performed. No intravenous contrast was administered.   COMPARISON:  MRI lumbar spine 03/08/2013   FINDINGS: Segmentation:  Normal   Alignment:  Moderate lumbar dextroscoliosis   Mild retrolisthesis L3-4.  Remaining alignment normal.   Vertebrae:  Negative for fracture or mass.   Conus medullaris and cauda equina: Conus extends to the L1-2 level. Conus and cauda equina appear normal.   Paraspinal and other soft tissues: Negative for paraspinous mass or adenopathy.   Disc levels:   L1-2: Negative   L2-3: Shallow central disc protrusion. Bilateral facet hypertrophy. Mild subarticular stenosis bilaterally. Spinal canal adequate in size   L3-4: Asymmetric disc degeneration and spurring related to scoliosis. Mild subarticular and foraminal stenosis on the left due to spurring. No interval change   L4-5: Shallow central disc protrusion. Left laminectomy. Bilateral facet hypertrophy. Moderate subarticular stenosis bilaterally. No interval change.   L5-S1: Asymmetric disc degeneration and spurring on the right. Moderate facet degeneration bilaterally. Right L5 nerve root impingement in the foramen similar to the prior study.   IMPRESSION: Mild subarticular stenosis bilaterally L2-3 and L3-4   Moderate subarticular stenosis bilaterally L4-5   Right L5 nerve root impingement in the foramen at L5-S1 similar to the prior study.   Moderate dextroscoliosis.     Electronically Signed   By: Carlin Gaskins M.D.   On: 10/12/2020 16:36    PAIN:    Pain Intensity: Present: 0/10, Best: 0/10, Worst: 8/10 Pain location: L lower back, L gluteal  Pain Quality: intermittent and sharp  Numbness/Tingling: Yes History of prior back injury, pain, surgery, or therapy: Yes  PRECAUTIONS: Fall  WEIGHT BEARING RESTRICTIONS: No  FALLS: Has patient fallen in last 6 months? No  Living Environment Lives with: lives with their  spouse Lives in: House/apartment Stairs: Yes: External: 3 steps; on right going up Has following equipment at home: Single point cane  Prior level of function: Independent  Occupational demands: Retired  Presenter, broadcasting:   Patient Goals: I would like to be able to sit longer and decrease my pain    OBJECTIVE:  Patient Surveys  mODI: 11 / 50 = 22.0 % - moderate disability  Cognition WNL     Gross Musculoskeletal Assessment Tremor: None Bulk: Increased muscle tension along  lumbar paraspinal musculature Tone: Normal  GAIT: Distance walked: 53m  Assistive device utilized: Single point cane Level of assistance: Modified independence Comments: Decreased Step Length, Decreased Stride Length, Slowed Velocity   Posture: Lumbar lordosis: WNL Iliac crest height: Equal bilaterally Lumbar lateral shift: Negative  Sitting: Increased Thoracic Kyphosis, Forward Posture, Rounded Shoulders   AROM AROM (Normal range in degrees) AROM   Lumbar   Flexion (65) 100%  Extension (30) 100%  Right lateral flexion (25) 100%  Left lateral flexion (25) 100%  Right rotation (30) 100%  Left rotation (30) 100%      Hip Right Left  Flexion (125) WNL WNL  Extension (15)    Abduction (40)    Adduction     Internal Rotation (45) 35 deg  20 deg  External Rotation (45) 40 deg 45 deg      Knee    Flexion (135) WNL WNL  Extension (0) WNL WNL      Ankle    Dorsiflexion (20)    Plantarflexion (50)    Inversion (35)    Eversion (15)    (* = pain; Blank rows = not tested)  LE MMT: MMT (out of 5) Right  Left   Hip flexion 3+ 3+  Hip extension    Hip abduction 4 4  Hip adduction 5 5  Hip internal rotation 4- 4-  Hip external rotation 4- 4-  Knee flexion 4- 4-  Knee extension 4- 4-  Ankle dorsiflexion 5 5  Ankle plantarflexion 5 5  Ankle inversion    Ankle eversion    (* = pain; Blank rows = not tested)  Sensation Grossly intact to light touch throughout bilateral LEs as determined  by testing dermatomes L2-S2. Proprioception, stereognosis, and hot/cold testing deferred on this date.  Reflexes R/L Knee Jerk (L3/4): Hyporeflexive  Ankle Jerk (S1/2): Hyporeflexive  Muscle Length Hamstrings: R: Positive for tightness - 25 deg L: Positive Thomas (hip flexors): R: Not examined L: Not examined  Palpation Location Right Left         Lumbar paraspinals 0 1  Quadratus Lumborum 0 0  Gluteus Maximus 0 0  Gluteus Medius 0 1  Deep hip external rotators 0 1  Piriformis 0 2  Fortin's Area (SIJ)    Greater Trochanter 0 0  (Blank rows = not tested) Graded on 0-4 scale (0 = no pain, 1 = pain, 2 = pain with wincing/grimacing/flinching, 3 = pain with withdrawal, 4 = unwilling to allow palpation)  Passive Accessory Intervertebral Motion Pt denies reproduction of back pain with CPA L1-L5 and UPA bilaterally L1-L5. Generally, hypomobile throughout  Special Tests Lumbar Radiculopathy and Discogenic: SLR (SN 92, -LR 0.29): R: Negative L:  Positive for pain (35 -40 deg) Crossed SLR (SP 90): R: Negative L: Negative  Facet Joint: Extension-Rotation (SN 100, -LR 0.0):  L: Negative  Hip: FABER (SN 81): R: Negative L: Negative FADIR (SN 94): R: Negative L: Negative  TODAY'S TREATMENT: DATE: 10/03/23  Subjective:  Patient reports continued pain with prolonged sitting. No pain currently. No questions or concern.    Therapeutic Exercise:   Seated Clamshell against resistance    3 x 10 x 5s hold - Blue TB   PT educated on proper use of heat modality with Piriformis stretch in order to reduce tension in L gluteal.  Therapeutic Activity:   Side Stepping with resistance   3 x 2 laps x 12' - Blue TB around thigh   Partial Squats with TB around  thighs    3 x 10 - Blue TB     Lateral Step Down     R/L: 1 x 10 - BUE Support     Standing marches   R/L: 1 x 10 ea leg    - Seated Rest break, moderate pain in L glute with leg going past 90 deg hip flexion   PATIENT  EDUCATION:  Education details: HEP, Exercise  Person educated: Patient Education method: Explanation, Demonstration, and Handouts Education comprehension: verbalized understanding and needs further education   HOME EXERCISE PROGRAM:  Access Code: [email or web address] URL: [email or web address] Date: 10/01/2023 Prepared by: Dorina Kingfisher  Exercises - Standing Lumbar Extension  - 1 x daily - 7 x weekly - 3 sets - 10-12 reps - Seated Piriformis Stretch  - 1 x daily - 7 x weekly - 3 sets - 30s hold - Seated Single Knee to Chest  - 1 x daily - 7 x weekly - 3 sets - 10 reps - Seated Hip Abduction  - 1 x daily - 7 x weekly - 3 sets - 10 reps - Sidelying Reverse Clamshell with Resistance  - 1 x daily - 7 x weekly - 3 sets - 10 reps   Access Code: [email or web address] URL: [email or web address] Date: 09/24/2023 Prepared by: Lonni Pall  Exercises - Standing Lumbar Extension  - 1 x daily - 7 x weekly - 3 sets - 10-12 reps - Seated Piriformis Stretch  - 1 x daily - 7 x weekly - 3 sets - 30s hold - Supine Bridge  - 1 x daily - 7 x weekly - 3 sets - 10-15 reps - Supine Lower Trunk Rotation  - 1 x daily - 7 x weekly - 3 sets - 10 reps - Sidelying Hip Abduction  - 1 x daily - 7 x weekly - 2-3 sets - 10-12 reps - Supine Single Knee to Chest  - 1 x daily - 7 x weekly - 3 sets - 30s hold   ASSESSMENT:  CLINICAL IMPRESSION: Continued PT POC focused on lower back pain with sciatic distribution. Session limited due to pain in L glute.  Patient tolerated all abduction based exercises and gluteal strengthening in standing without pain provocation. Pain upon sitting for seated rest break. Unable to achieve supine position for piriformis stretch. Pt remained in standing due to onset of pain upon transferring into sitting position. PT suspects increased muscle tension in gluteal muscles but patient unable to perform figure 4 or piriformis stretch to relieve muscle tension. PT educated patient on  proper HEP stretch using heat modalities in order to mitigate pain in L gluteal muscle. Pt will benefit from skilled PT in order to address current deficits and maximize return to PLOF.   OBJECTIVE IMPAIRMENTS: decreased activity tolerance, difficulty walking, decreased ROM, decreased strength, and pain.   ACTIVITY LIMITATIONS: sitting, standing, squatting, and sleeping  PARTICIPATION LIMITATIONS: driving and community activity  PERSONAL FACTORS: Age, Past/current experiences, Time since onset of injury/illness/exacerbation, and 1-2 comorbidities: Hx of Spinal Stenosis, DM are also affecting patient's functional outcome.   REHAB POTENTIAL: Good  CLINICAL DECISION MAKING: Evolving/moderate complexity  EVALUATION COMPLEXITY: Moderate   GOALS: Goals reviewed with patient? No  SHORT TERM GOALS: Target date: 10/31/2023  Pt will be independent with HEP in order to improve strength and decrease back pain to improve pain-free function at home and work. Baseline: initial hep provided  Goal status: INITIAL   LONG TERM GOALS: Target date: 11/28/2023  Pt will decrease 5TSTS by at  least 3 seconds in order to demonstrate clinically significant improvement in LE strength Baseline: TBD Goal status: INITIAL  2.  Pt will decrease worst back pain by at least 2 points on the NPRS in order to demonstrate clinically significant reduction in back pain. Baseline: 09/24/2023: 10/10 Goal status: INITIAL  3.  Pt will decrease mODI score by at least 13 points in order demonstrate clinically significant reduction in back pain/disability.       Baseline: 09/24/2023: 22% Goal status: INITIAL  4.  Pt will report ability to sit > 30 min- 1 hr without pain in lower back/L hip in order to demonstrate significant reduction in lower back pain and improvements in sitting.   Baseline: 09/24/2023: > 5/10 with prolonged sitting  Goal status: INITIAL   PLAN: PT FREQUENCY: 1-2x/week  PT DURATION: 8  weeks  PLANNED INTERVENTIONS: Therapeutic exercises, Therapeutic activity, Neuromuscular re-education, Balance training, Gait training, Patient/Family education, Self Care, Joint mobilization, Joint manipulation, Vestibular training, Canalith repositioning, Orthotic/Fit training, DME instructions, Dry Needling, Electrical stimulation, Spinal manipulation, Spinal mobilization, Cryotherapy, Moist heat, Taping, Traction, Ultrasound, Ionotophoresis 4mg /ml Dexamethasone , Manual therapy, and Re-evaluation.  PLAN FOR NEXT SESSION: Review updated HEP; initiate core strengthening, hip strengthening, sciatic nerve glide   Dorina HERO. Fairly IV, PT, DPT Physical Therapist- [facility] 10/03/2023, 1:06 PM

## 2023-10-08 ENCOUNTER — Ambulatory Visit

## 2023-10-10 DIAGNOSIS — M5416 Radiculopathy, lumbar region: Secondary | ICD-10-CM | POA: Diagnosis not present

## 2023-10-10 DIAGNOSIS — M961 Postlaminectomy syndrome, not elsewhere classified: Secondary | ICD-10-CM | POA: Diagnosis not present

## 2023-10-12 DIAGNOSIS — M545 Low back pain, unspecified: Secondary | ICD-10-CM | POA: Diagnosis not present

## 2023-10-14 ENCOUNTER — Ambulatory Visit

## 2023-10-16 ENCOUNTER — Ambulatory Visit

## 2023-10-17 DIAGNOSIS — M5416 Radiculopathy, lumbar region: Secondary | ICD-10-CM | POA: Diagnosis not present

## 2023-10-18 DIAGNOSIS — R55 Syncope and collapse: Secondary | ICD-10-CM | POA: Diagnosis not present

## 2023-10-18 DIAGNOSIS — E7800 Pure hypercholesterolemia, unspecified: Secondary | ICD-10-CM | POA: Diagnosis not present

## 2023-10-18 DIAGNOSIS — I10 Essential (primary) hypertension: Secondary | ICD-10-CM | POA: Diagnosis not present

## 2023-10-22 ENCOUNTER — Encounter

## 2023-10-24 ENCOUNTER — Encounter

## 2023-10-25 DIAGNOSIS — M5416 Radiculopathy, lumbar region: Secondary | ICD-10-CM | POA: Diagnosis not present

## 2023-10-29 ENCOUNTER — Encounter

## 2023-10-31 ENCOUNTER — Encounter

## 2023-11-05 ENCOUNTER — Encounter

## 2023-11-07 ENCOUNTER — Encounter

## 2023-11-08 DIAGNOSIS — H353221 Exudative age-related macular degeneration, left eye, with active choroidal neovascularization: Secondary | ICD-10-CM | POA: Diagnosis not present

## 2023-11-12 DIAGNOSIS — M5416 Radiculopathy, lumbar region: Secondary | ICD-10-CM | POA: Diagnosis not present

## 2023-11-15 DIAGNOSIS — Z23 Encounter for immunization: Secondary | ICD-10-CM | POA: Diagnosis not present

## 2023-11-27 DIAGNOSIS — H353131 Nonexudative age-related macular degeneration, bilateral, early dry stage: Secondary | ICD-10-CM | POA: Diagnosis not present

## 2023-11-27 DIAGNOSIS — H527 Unspecified disorder of refraction: Secondary | ICD-10-CM | POA: Diagnosis not present

## 2023-11-27 DIAGNOSIS — Z961 Presence of intraocular lens: Secondary | ICD-10-CM | POA: Diagnosis not present

## 2023-11-27 DIAGNOSIS — H353221 Exudative age-related macular degeneration, left eye, with active choroidal neovascularization: Secondary | ICD-10-CM | POA: Diagnosis not present

## 2023-12-23 DIAGNOSIS — H353131 Nonexudative age-related macular degeneration, bilateral, early dry stage: Secondary | ICD-10-CM | POA: Diagnosis not present

## 2023-12-24 ENCOUNTER — Other Ambulatory Visit: Payer: Self-pay | Admitting: Family Medicine

## 2023-12-24 NOTE — Telephone Encounter (Signed)
 Last office visit 09/05/23 for Urinary Frequency, back apin and constipation.  Last refilled 10/01/23 for #90 with no refills.  Next appt: CPE 05/27/2024.

## 2023-12-25 ENCOUNTER — Other Ambulatory Visit: Payer: Self-pay | Admitting: Family Medicine

## 2023-12-28 ENCOUNTER — Other Ambulatory Visit: Payer: Self-pay | Admitting: Family Medicine

## 2024-01-28 ENCOUNTER — Other Ambulatory Visit: Payer: Self-pay | Admitting: Family Medicine

## 2024-02-20 ENCOUNTER — Telehealth: Payer: Self-pay | Admitting: Podiatry

## 2024-02-20 NOTE — Telephone Encounter (Signed)
 Patient called in to reschedule their New Patient appt with Dr. Tobie due to the weather; needed to reschedule asap swelling & bleeding. The patient had many limitations to their personal schedule, (No Tues or Thurs at all, No Wed mornings, no Monday evenings & many conflicting appts) making it so changing providers was the best option to get her seen asap. Appt set for 945 on 03/06/24 with Dr. Janit

## 2024-02-25 ENCOUNTER — Ambulatory Visit: Admitting: Podiatry

## 2024-02-26 ENCOUNTER — Ambulatory Visit: Admitting: Podiatry

## 2024-03-06 ENCOUNTER — Encounter: Payer: Self-pay | Admitting: Podiatry

## 2024-03-06 ENCOUNTER — Ambulatory Visit: Admitting: Podiatry

## 2024-03-06 ENCOUNTER — Ambulatory Visit

## 2024-03-06 VITALS — Ht 64.0 in | Wt 160.0 lb

## 2024-03-06 DIAGNOSIS — E08621 Diabetes mellitus due to underlying condition with foot ulcer: Secondary | ICD-10-CM

## 2024-03-06 MED ORDER — SILVER SULFADIAZINE 1 % EX CREA: 1.0000 | TOPICAL_CREAM | Freq: Every day | CUTANEOUS | 1 refills | Status: AC

## 2024-03-06 NOTE — Progress Notes (Signed)
 "  Chief Complaint  Patient presents with   Toe Pain    Pt is here due to left great toe states the toe has been swelling and bleeding has a callus there.    HPI: 84 y.o. femalepresenting for evaluation of an ulcer to the plantar aspect of the left great toe.  She states that she peeled a large piece of callus off of the plantar aspect of the toe.  She admits to walking around the house and socks only  Past Medical History:  Diagnosis Date   Diabetes mellitus (HCC)    Environmental allergies    Hypertension    Liver failure (HCC)    Pancreatitis     Past Surgical History:  Procedure Laterality Date   CARPAL TUNNEL RELEASE  8/91   CHOLECYSTECTOMY  10/80   COLONOSCOPY     ERCP  3/95   fusion on fingers  2000 2001   INCONTINENCE SURGERY  09/17/00   KNEE ARTHROPLASTY  10/25/95   x2   KNEE ARTHROSCOPY  7/92, 93, 94   x3   LIVER BIOPSY  5/95   perforated colon  3/07   SHOULDER ARTHROSCOPY  3/92   spur cut muscle   sigmoid colon surgery  07/17/05    Allergies[1]   Physical Exam: General: The patient is alert and oriented x3 in no acute distress.  Dermatology: Skin is warm, dry and supple bilateral lower extremities.  Superficial ulcer noted to the plantar aspect of the left great toe measuring approximately 0.5 x 0.5 x 0.1 cm.  It appears very stable.  Granular wound base.  No exposed bone muscle tendon ligament or joint.  No drainage.  No malodor periwound is callused  Vascular: Palpable pedal pulses bilaterally. Capillary refill within normal limits.  No appreciable edema.  No erythema.  Neurological: Grossly intact via light touch  Musculoskeletal Exam: No pedal deformities noted   Assessment/Plan of Care: 1.  Diabetic ulcer left great toe  - Patient evaluated -Medically necessary excisional debridement including subcutaneous tissue was performed today using a tissue nipper.  Excisional debridement of the necrotic nonviable tissue down to healthier bleeding viable  tissue was performed with postdebridement measurement same as pre- -Prescription for Silvadene  cream applied daily under occlusion with a Band-Aid -Refrain from going barefoot or socks only.  Currently she is wearing some SAS shoes.  Continue even at home -Overall the wound appears very stable with good potential for healing -Return to clinic 3 months for routine footcare     Thresa EMERSON Sar, DPM [facility]  Dr. Thresa EMERSON Sar, DPM    [address] [place] [address]                Office [phone]  Fax [phone]        [1]  Allergies Allergen Reactions   Amoxicillin-Pot Clavulanate Diarrhea   Codeine Anaphylaxis    Bad respiratory problems   Demerol [Meperidine] Anaphylaxis    She thinks it has codeine in it    Talwin [Pentazocine] Anaphylaxis    Affects liver and respiratory problems   Tessalon [Benzonatate] Anaphylaxis    Trachea will get so inflammed   Vicodin [Hydrocodone-Acetaminophen] Anaphylaxis    Major respiratory   Betadine [  Povidone Iodine]     Skin sloughes off   Tape    Calcium  Channel Blockers Rash    Double vision and rash; cant even take CA2+ pills   Iodine Rash    Rash and respiratory   Neosporin [Neomycin-Bacitracin Zn-Polymyx] Rash   Sulfa Antibiotics Rash   "

## 2024-03-09 ENCOUNTER — Ambulatory Visit: Admitting: Podiatry

## 2024-05-20 ENCOUNTER — Other Ambulatory Visit

## 2024-05-22 ENCOUNTER — Ambulatory Visit

## 2024-05-27 ENCOUNTER — Encounter: Admitting: Family Medicine
